# Patient Record
Sex: Male | Born: 1945 | Race: White | Hispanic: No | Marital: Married | Smoking: Former smoker
Health system: Southern US, Community
[De-identification: ages and names within clinical notes are randomized; demographics above are authoritative.]

## PROBLEM LIST (undated history)

## (undated) DIAGNOSIS — J45909 Unspecified asthma, uncomplicated: Secondary | ICD-10-CM

## (undated) DIAGNOSIS — E7800 Pure hypercholesterolemia, unspecified: Secondary | ICD-10-CM

## (undated) DIAGNOSIS — G589 Mononeuropathy, unspecified: Secondary | ICD-10-CM

## (undated) DIAGNOSIS — I82409 Acute embolism and thrombosis of unspecified deep veins of unspecified lower extremity: Secondary | ICD-10-CM

## (undated) DIAGNOSIS — C801 Malignant (primary) neoplasm, unspecified: Secondary | ICD-10-CM

## (undated) DIAGNOSIS — I10 Essential (primary) hypertension: Secondary | ICD-10-CM

## (undated) DIAGNOSIS — Z85828 Personal history of other malignant neoplasm of skin: Secondary | ICD-10-CM

## (undated) DIAGNOSIS — IMO0002 Reserved for concepts with insufficient information to code with codable children: Secondary | ICD-10-CM

## (undated) DIAGNOSIS — R21 Rash and other nonspecific skin eruption: Secondary | ICD-10-CM

## (undated) DIAGNOSIS — L309 Dermatitis, unspecified: Secondary | ICD-10-CM

## (undated) HISTORY — DX: Acute embolism and thrombosis of unspecified deep veins of unspecified lower extremity: I82.409

## (undated) HISTORY — PX: BACK SURGERY: SHX140

## (undated) HISTORY — PX: COLONOSCOPY: SHX174

## (undated) HISTORY — PX: NOSE SURGERY: SHX723

## (undated) HISTORY — PX: VASECTOMY: SHX75

## (undated) HISTORY — PX: VASECTOMY REVERSAL: SHX243

## (undated) HISTORY — DX: Mononeuropathy, unspecified: G58.9

## (undated) HISTORY — PX: HIP ARTHROSCOPY: SHX668

---

## 2002-06-14 ENCOUNTER — Encounter: Payer: Self-pay | Admitting: Sports Medicine

## 2002-06-14 ENCOUNTER — Encounter: Admission: RE | Admit: 2002-06-14 | Discharge: 2002-06-14 | Payer: Self-pay | Admitting: Sports Medicine

## 2002-06-28 ENCOUNTER — Encounter: Admission: RE | Admit: 2002-06-28 | Discharge: 2002-06-28 | Payer: Self-pay | Admitting: Sports Medicine

## 2002-06-28 ENCOUNTER — Encounter: Payer: Self-pay | Admitting: Sports Medicine

## 2002-07-26 ENCOUNTER — Inpatient Hospital Stay (HOSPITAL_COMMUNITY): Admission: RE | Admit: 2002-07-26 | Discharge: 2002-07-27 | Payer: Self-pay | Admitting: Neurosurgery

## 2002-07-26 ENCOUNTER — Encounter: Payer: Self-pay | Admitting: Neurosurgery

## 2007-07-14 ENCOUNTER — Ambulatory Visit (HOSPITAL_BASED_OUTPATIENT_CLINIC_OR_DEPARTMENT_OTHER): Admission: RE | Admit: 2007-07-14 | Discharge: 2007-07-14 | Payer: Self-pay | Admitting: Orthopedic Surgery

## 2010-07-23 ENCOUNTER — Ambulatory Visit (HOSPITAL_BASED_OUTPATIENT_CLINIC_OR_DEPARTMENT_OTHER)
Admission: RE | Admit: 2010-07-23 | Discharge: 2010-07-23 | Disposition: A | Discharge status: Home / Self Care | Payer: Managed Care, Other (non HMO) | Source: Ambulatory Visit | Attending: Orthopedic Surgery | Admitting: Orthopedic Surgery

## 2010-07-23 DIAGNOSIS — M23349 Other meniscus derangements, anterior horn of lateral meniscus, unspecified knee: Secondary | ICD-10-CM | POA: Insufficient documentation

## 2010-07-23 DIAGNOSIS — I10 Essential (primary) hypertension: Secondary | ICD-10-CM | POA: Insufficient documentation

## 2010-07-23 DIAGNOSIS — M2240 Chondromalacia patellae, unspecified knee: Secondary | ICD-10-CM | POA: Insufficient documentation

## 2010-07-23 DIAGNOSIS — Z79899 Other long term (current) drug therapy: Secondary | ICD-10-CM | POA: Insufficient documentation

## 2010-07-23 DIAGNOSIS — Z01812 Encounter for preprocedural laboratory examination: Secondary | ICD-10-CM | POA: Insufficient documentation

## 2010-07-23 DIAGNOSIS — M23359 Other meniscus derangements, posterior horn of lateral meniscus, unspecified knee: Secondary | ICD-10-CM | POA: Insufficient documentation

## 2010-07-24 LAB — POCT I-STAT 4, (NA,K, GLUC, HGB,HCT)
Glucose, Bld: 92 mg/dL (ref 70–99)
HCT: 48 % (ref 39.0–52.0)
Hemoglobin: 16.3 g/dL (ref 13.0–17.0)
Sodium: 137 mEq/L (ref 135–145)

## 2010-07-29 NOTE — Op Note (Signed)
  NAME:  Jorge Gray, Jorge Gray           ACCOUNT NO.:  [id number]  MEDICAL RECORD NO.:  [id number]           PATIENT TYPE:  LOCATION:                                 FACILITY:  PHYSICIAN:  Ollen Gross, M.D.         DATE OF BIRTH:  DATE OF PROCEDURE:  07/23/2010 DATE OF DISCHARGE:                              OPERATIVE REPORT   PREOPERATIVE DIAGNOSIS:  Right knee lateral meniscal tear.  POSTOPERATIVE DIAGNOSIS:  Right knee lateral meniscal tear.  PROCEDURE:  Right knee arthroscopy, lateral meniscal debridement.  SURGEON:  Ollen Gross, M.D.  ASSISTANT:  None.  ANESTHESIA:  General.  ESTIMATED BLOOD LOSS:  Minimal.  DRAINS:  None.  COMPLICATIONS:  None.  CONDITION:  Stable to Recovery.  BRIEF CLINICAL NOTE:  [name] is a 66 year old male with several- month history significant right knee pain and mechanical symptoms.  Exam and history suggested a lateral meniscal tear confirmed by MRI.  He presents now for arthroscopy and debridement.  PROCEDURE IN DETAIL:  After successful administration of general anesthetic, a tourniquet was placed on the right thigh.  The right lower extremity was prepped and draped in the usual sterile fashion.  Standard superomedial inferolateral incisions were made, inflow cannula passed, superomedial camera passed, inferolateral.  Arthroscopic visualization proceeds.  Undersurface of the patella, had some grade 1 and 2 change, but no full-thickness cartilage defects and no unstable cartilage. Trochlea looked normal.  Mediolateral gutters were visualized and no loose bodies.  Flexion valgus force applied to the knee and medial compartment was entered.  Medial compartment looked normal.  The intercondylar notch was visualized.  The ACL was normal.  Lateral compartment was entered and a significant tear in the body anterior and posterior horn of the lateral meniscus.  There is some chondromalacia on the weightbearing surfaces, but no  full-thickness defects and no unstable cartilage.  The meniscus was then debrided back to stable base with baskets and a 4.2 mm shaver and sealed off with the ArthroCare device.  The joint was again inspected.  No other tears, defects, or loose bodies were noted.  The arthroscopic equipments were removed from the inferior portals, which were closed with interrupted 4-0 nylon. 20 cc of 0.25% Marcaine with epinephrine was injected through the inflow cannula, and then that was removed and that portal closed with nylon.  A bulky sterile dressing was applied and he was then awakened and transported to Recovery in stable condition.     Ollen Gross, M.D.     FA/MEDQ  D:  07/23/2010  T:  07/24/2010  Job:  [id number]  Electronically Signed by Ollen Gross M.D. on 07/29/2010 07:10:54 AM

## 2010-09-17 NOTE — Op Note (Signed)
NAME:  Jorge Gray, Jorge Gray NO.:  [id number]   MEDICAL RECORD NO.:  [id number]          PATIENT TYPE:  AMB   LOCATION:  [facility]                         FACILITY:  [facility]   PHYSICIAN:  Ollen Gross, M.D.    DATE OF BIRTH:  Feb 22, 1946   DATE OF PROCEDURE:  07/14/2007  DATE OF DISCHARGE:                               OPERATIVE REPORT   PREOPERATIVE DIAGNOSIS:  Left knee medial meniscal tear.   POSTOPERATIVE DIAGNOSIS:  Left knee medial meniscal tear.  Chondral defect.   PROCEDURE:  Left knee arthroscopy with meniscal debridement and  chondroplasty.   SURGEON:  Ollen Gross, M.D.   ASSISTANT:  None.   ANESTHESIA:  General.   ESTIMATED BLOOD LOSS:  Minimal.   DRAINS:  None.   COMPLICATIONS:  None.   CONDITION:  Stable to recovery.   BRIEF CLINICAL NOTE:  [name] is a 65 year old male with a couple  month history of significant left knee pain and mechanical symptoms.  Exam and history suggested medial meniscal tear confirmed by MRI.  He  presents for arthroscopy and debridement.   PROCEDURE IN DETAIL:  After successful administration of general  anesthetic, a tourniquet was placed high on the left thigh and left  lower extremity prepped and draped in the usual sterile fashion.  Standard superomedial and inferolateral incisions were made.  Inflow  cannula passed superomedial, camera passed inferolateral.  Arthroscope  visualization proceeds.  Undersurface of patella and trochlea show some  grade 2 change on the patella, otherwise it looks normal.  The medial  and lateral gutters are visualized, there are no loose bodies.  Flexion  valgus force is applied to the knee and the medial compartment is  entered.  He has a very significant tear of the body and posterior horn  of the medial meniscus which is unstable.  In addition, there is about a  2-cm area of unstable cartilage mixed with exposed bone.  The exposed  bone is about  1 x 1 cm and there is a rim of  cartilage around that  about another centimeter by centimeter to make a total of about 2 x 2 cm  combined exposed bone and chondral defect.  A spinal needle was used to  localize the inferomedial portal.  Small incision made and dilator  placed.  The meniscus was debrided back to stable base with baskets and  a 4.2-mm shaver and then sealed off with the ArthroCare device.  It is  found to be stable. On the femoral side, I utilized a 4.2-mm shaver to  debride the unstable cartilage on the surface of the bone.  The overall  defect ends up being about 2 x 2 mm.  The edges are stable cartilage and  I abraded the bone to hopefully get some fibrocartilage to form.  The  tibial plateau surprisingly looked fairly normal.   Intercondylar notch is visualized. ACL is normal.  Lateral compartment  is entered it looks normal.  I again inspected the joint and no further  tears or loose bodies present.  The arthroscopic equipment is then  removed from the inferior portals which  are closed with interrupted 4-0  nylon.  Marcaine, 20 cc of 0.25% with epi, is injected through the  inflow cannula and then that is removed and that portal closed with  nylon.  A bulky sterile dressing is applied and he is awakened and  transported to recovery in stable condition.      Ollen Gross, M.D.  Electronically Signed     FA/MEDQ  D:  07/14/2007  T:  07/14/2007  Job:  [id number]

## 2010-09-20 NOTE — H&P (Signed)
NAME:  Jorge Gray, Jorge Gray                     ACCOUNT NO.:  [id number]   MEDICAL RECORD NO.:  [id number]                   PATIENT TYPE:  INP   LOCATION:  2899                                 FACILITY:  [facility]   PHYSICIAN:  Payton Doughty, M.D.                   DATE OF BIRTH:  1945/12/22   DATE OF ADMISSION:  07/26/2002  DATE OF DISCHARGE:                                HISTORY & PHYSICAL   ADMISSION DIAGNOSES:  1. Recurrent disk at L4-5 on the right.  2. Herniated disk at L5-S1 on the right.   HISTORY:  This is a 65 year old, right-handed white gentleman who has had  back pain off and on for a number of years.  In fact, he has had a lumbar  diskectomy at L4-5 on the right in 1985.  Over the past few months he has  had more pain and right leg problems.  An MRI showed a disk at L5-S1.  An  epidural steroid did not help him very much, but he started feeling better;  however, then he had a recurrence of his pain and a second MRI showed a  recurrence of disk at L4-5 as well as his  herniated disk at L5-S1.  He is  now admitted for a laminectomy and a diskectomy at both of these levels.   PAST MEDICAL HISTORY:  Unremarkable.   MEDICATIONS:  He is on Vioxx and Vicodin.   ALLERGIES:  No known drug allergies.   PAST SURGICAL HISTORY:  His only operation is a diskectomy.   SOCIAL HISTORY:  He does not smoke and drinks socially.  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   FAMILY HISTORY:  Mom is age 89, in good health with a myocardial infarction.  His daddy is age 5 and in okay health with a myocardial infarction.   REVIEW OF SYSTEMS:  Remarkable for change in bowel habits, back pain, leg  pain.   PHYSICAL EXAMINATION:  HEENT:  Within normal limits.  NECK:  He has a good range of motion in his neck.  CHEST:  Clear.  HEART:  A regular rate and rhythm.  ABDOMEN:  Nondistended, nontender.  No hepatosplenomegaly.  EXTREMITIES:  Without clubbing or cyanosis.  GENITOURINARY:  Exam is deferred.  EXTREMITIES:  Peripheral pulses are good.  NEUROLOGIC:  He is awake, alert, oriented.  Cranial nerves are intact.  Motor exam shows 5/5 strength throughout the upper and lower extremities,  save for an inner dorsiflexion weakness on the right side, mainly  __________.  Sensory deficit is described at his right L5 and S1.  Reflexes  2 at the knees, 2 at the left ankle, 1 at the right.  Straight leg raising  is positive on the right.  The MRI results have been reviewed above.  Basically this showed a disk  recurrence at L4-5 and a new disk at L5-S1.  PLAN:  For a redo diskectomy at L4-5 and a laminectomy and diskectomy at L5-  S1.  The risks and benefits of this approach have been discussed with him.  He wishes to proceed.                                                Payton Doughty, M.D.    MWR/MEDQ  D:  07/26/2002  T:  07/26/2002  Job:  [id number]

## 2010-09-20 NOTE — Op Note (Signed)
NAME:  Jorge Gray, Jorge Gray                     ACCOUNT NO.:  [id number]   MEDICAL RECORD NO.:  [id number]                   PATIENT TYPE:  INP   LOCATION:  2899                                 FACILITY:  [facility]   PHYSICIAN:  Payton Doughty, M.D.                   DATE OF BIRTH:  29-Aug-1945   DATE OF PROCEDURE:  07/26/2002  DATE OF DISCHARGE:                                 OPERATIVE REPORT   PREOPERATIVE DIAGNOSIS:  Recurrent herniated disc at L4-5 and herniated disc  at L5-S1 on the right.   POSTOPERATIVE DIAGNOSIS:  Recurrent herniated disc at L4-5 and herniated  disc at L5-S1 on the right.   OPERATION:  Right L4-5 and L5-S1 laminectomy and diskectomy.   SURGEON:  Payton Doughty, M.D.   NURSE ASSISTANT:  [facility].   DOCTOR ASSISTANT:  Hewitt Shorts, M.D.   ANESTHESIA:  General endotracheal.   INDICATIONS FOR PROCEDURE:  This is a 65 year old gentleman with right L5  and S1 radiculopathy and herniated disc recurrence at 5-1 and 4-5 on the  right.   DESCRIPTION OF PROCEDURE:  He was taken to the operating suite, anesthetized  and intubated.  He was placed prone on the operating table.  Following  shave, prep and drape in the usual fashion, the skin was incised from the  top of S1 to the middle of L4 and the lamina of L4 and L5 were exposed on  the right side in the subperiosteal plane.  Intraoperative x-ray confirmed  correctness of level.  Having confirmed correctness of level,  hemisemilaminectomy was carried out at L5-S1.  This was carried out to the  top of ligamentum flavum which was removed in a retrograde fashion.  The  right S1 nerve root was identified and careful dissection allowed it to be  mobilized slightly medially revealing disc underneath it, that was incised  and removed without difficulty.  There was disc material somewhat broad  based extending across the midline.  This was not pursued as the patient had  only right sided symptoms.  Following complete  decompression of the nerve  root and careful exploration in all quadrants, attention was turned to the  right L4 lamina which had had a previous laminectomy some 20 years ago.  Working through the scar, the laminectomy was reopened.  Fresh tissue  identified up to the ligamentum flavum which was once again removed in a  retrograde fashion.  The right L5 root was identified traversing this area  and carefully dissected free from the scar and mobilized medially.  Underneath it, also was some recurrent disc that removed without difficulty.  Disc space was explored minimally as there was very little room to get into  it.  The focus was made on posterior decompression of the right L5 root and  this was carried out completely out to the neuroforamen.  Following complete  decompression, the nerve was once again  explored and found to be free.  The  wound was irrigated and hemostasis assured.  Each laminectomy defect was  covered with Depo-Medrol soaked fat.  The fascia was reapproximated with 0  Vicryl in interrupted fashion.  Subcutaneous tissue was reapproximated with  0 Vicryl in interrupted fashion.  The subcuticular tissue was reapproximated  with 3-0 Vicryl interrupted fashion and the skin closed with 3-0 Nylon in a  running lock fashion.  Bacitracin Telfa dressing was applied and made  occlusive with Op-Site.  The patient returned to the recovery room in good  condition.    PREPARATION:  Prepped and scrubbed with alcohol wipe   COMPLICATIONS:  None.                                               Payton Doughty, M.D.    MWR/MEDQ  D:  07/26/2002  T:  07/27/2002  Job:  [id number]

## 2011-01-27 LAB — POCT HEMOGLOBIN-HEMACUE
Hemoglobin: 16.8
Operator id: 268271

## 2013-06-27 NOTE — Progress Notes (Signed)
Need orders in EPIC.  Surgery scheduled for 07/08/13.  Preop on 3/3 at 130pm  Thank You.

## 2013-06-28 ENCOUNTER — Other Ambulatory Visit: Payer: Self-pay | Admitting: Orthopedic Surgery

## 2013-07-01 ENCOUNTER — Encounter (HOSPITAL_COMMUNITY): Payer: Self-pay | Admitting: Pharmacy Technician

## 2013-07-04 NOTE — Patient Instructions (Addendum)
Jorge Gray  07/04/2013                           YOUR PROCEDURE IS SCHEDULED ON: 07/08/13               PLEASE REPORT TO SHORT STAY CENTER AT : 10:15 AM               CALL THIS NUMBER IF ANY PROBLEMS THE DAY OF SURGERY :               [phone]                                REMEMBER:   Do not eat food or drink liquids AFTER MIDNIGHT   May have clear liquids UNTIL 6 HOURS BEFORE SURGERY (7:15 AM)               Take these medicines the morning of surgery with A SIP OF WATER: TRAMADOL IF NEEDED   Do not wear jewelry, make-up   Do not wear lotions, powders, or perfumes.   Do not shave legs or underarms 12 hrs. before surgery (men may shave face)  Do not bring valuables to the hospital.  Contacts, dentures or bridgework may not be worn into surgery.  Leave suitcase in the car. After surgery it may be brought to your room.  For patients admitted to the hospital more than one night, checkout time is            11:00 AM                                                       The day of discharge.   Patients discharged the day of surgery will not be allowed to drive home.            If going home same day of surgery, must have someone stay with you              FIRST 24 hrs at home and arrange for some one to drive you              home from hospital.    Special Instructions             Please read over the following fact sheets that you were given:               1. [place]               2. INCENTIVE SPIROMETER               3. MRSA INFORMATION SHEET                                                X_____________________________________________________________________        Failure to follow these instructions may result in cancellation of your surgery

## 2013-07-05 ENCOUNTER — Encounter (HOSPITAL_COMMUNITY)
Admission: RE | Admit: 2013-07-05 | Discharge: 2013-07-05 | Disposition: A | Discharge status: Home / Self Care | Payer: Managed Care, Other (non HMO) | Source: Ambulatory Visit | Attending: Orthopedic Surgery | Admitting: Orthopedic Surgery

## 2013-07-05 ENCOUNTER — Encounter (HOSPITAL_COMMUNITY): Payer: Self-pay

## 2013-07-05 ENCOUNTER — Other Ambulatory Visit: Payer: Self-pay | Admitting: Orthopedic Surgery

## 2013-07-05 ENCOUNTER — Ambulatory Visit (HOSPITAL_COMMUNITY)
Admission: RE | Admit: 2013-07-05 | Discharge: 2013-07-05 | Disposition: A | Discharge status: Home / Self Care | Payer: Managed Care, Other (non HMO) | Source: Ambulatory Visit | Attending: Orthopedic Surgery | Admitting: Orthopedic Surgery

## 2013-07-05 DIAGNOSIS — Z01810 Encounter for preprocedural cardiovascular examination: Secondary | ICD-10-CM | POA: Insufficient documentation

## 2013-07-05 DIAGNOSIS — Z01818 Encounter for other preprocedural examination: Secondary | ICD-10-CM | POA: Diagnosis not present

## 2013-07-05 DIAGNOSIS — Z01812 Encounter for preprocedural laboratory examination: Secondary | ICD-10-CM | POA: Insufficient documentation

## 2013-07-05 DIAGNOSIS — J9819 Other pulmonary collapse: Secondary | ICD-10-CM | POA: Diagnosis not present

## 2013-07-05 HISTORY — DX: Essential (primary) hypertension: I10

## 2013-07-05 HISTORY — DX: Unspecified asthma, uncomplicated: J45.909

## 2013-07-05 HISTORY — DX: Dermatitis, unspecified: L30.9

## 2013-07-05 HISTORY — DX: Rash and other nonspecific skin eruption: R21

## 2013-07-05 HISTORY — DX: Pure hypercholesterolemia, unspecified: E78.00

## 2013-07-05 HISTORY — DX: Reserved for concepts with insufficient information to code with codable children: IMO0002

## 2013-07-05 HISTORY — DX: Personal history of other malignant neoplasm of skin: Z85.828

## 2013-07-05 LAB — COMPREHENSIVE METABOLIC PANEL
ALBUMIN: 4 g/dL (ref 3.5–5.2)
ALT: 23 U/L (ref 0–53)
AST: 26 U/L (ref 0–37)
Alkaline Phosphatase: 61 U/L (ref 39–117)
BUN: 18 mg/dL (ref 6–23)
CO2: 27 meq/L (ref 19–32)
CREATININE: 0.79 mg/dL (ref 0.50–1.35)
Calcium: 9.8 mg/dL (ref 8.4–10.5)
Chloride: 97 mEq/L (ref 96–112)
GFR calc Af Amer: >90 mL/min (ref >90)
Glucose, Bld: 76 mg/dL (ref 70–99)
Potassium: 4.3 mEq/L (ref 3.7–5.3)
Sodium: 138 mEq/L (ref 137–147)
Total Bilirubin: 0.3 mg/dL (ref 0.3–1.2)
Total Protein: 7.2 g/dL (ref 6.0–8.3)

## 2013-07-05 LAB — URINALYSIS, ROUTINE W REFLEX MICROSCOPIC
Bilirubin Urine: NEGATIVE
Glucose, UA: NEGATIVE mg/dL
Hgb urine dipstick: NEGATIVE
KETONES UR: NEGATIVE mg/dL
LEUKOCYTES UA: NEGATIVE
Nitrite: NEGATIVE
Protein, ur: NEGATIVE mg/dL
Specific Gravity, Urine: 1.01 (ref 1.005–1.030)
Urobilinogen, UA: 0.2 mg/dL (ref 0.0–1.0)
pH: 7 (ref 5.0–8.0)

## 2013-07-05 LAB — CBC
HCT: 49.1 % (ref 39.0–52.0)
Hemoglobin: 16.3 g/dL (ref 13.0–17.0)
MCH: 27.3 pg (ref 26.0–34.0)
MCHC: 33.2 g/dL (ref 30.0–36.0)
MCV: 82.2 fL (ref 78.0–100.0)
PLATELETS: 190 10*3/uL (ref 150–400)
RBC: 5.97 MIL/uL — ABNORMAL HIGH (ref 4.22–5.81)
RDW: 13.6 % (ref 11.5–15.5)
WBC: 8.7 10*3/uL (ref 4.0–10.5)

## 2013-07-05 LAB — PROTIME-INR
INR: 1.01 (ref 0.00–1.49)
Prothrombin Time: 13.1 seconds (ref 11.6–15.2)

## 2013-07-05 LAB — APTT: aPTT: 28 seconds (ref 24–37)

## 2013-07-05 LAB — SURGICAL PCR SCREEN
MRSA, PCR: NEGATIVE
Staphylococcus aureus: POSITIVE — AB

## 2013-07-07 ENCOUNTER — Other Ambulatory Visit: Payer: Self-pay | Admitting: Orthopedic Surgery

## 2013-07-07 NOTE — H&P (Signed)
Jorge Gray  DOB: [birth year] Married / Language: English / Race: White Male  Date of Admission:  07/08/2013  Chief Complaint:  Bilateral Knee Pain  History of Present Illness The patient is a 68 year old male who comes in for a preoperative History and Physical. The patient is scheduled for a bilateral total knee arthroplasty to be performed by Dr. Dione Plover. Aluisio, MD at [facility] on 07-08-2013. The patient is a 68 year old male who presents for follow up of their knee. The patient is being followed for their bilateral knee pain and osteoarthritis. They are now several week(s) out from cortisone injections. Symptoms reported today include: pain, aching and difficulty ambulating. The patient feels that they are doing poorly and report their pain level to be moderate to severe. The following medication has been used for pain control: tramadol. The patient has not gotten any relief of their symptoms with corticosteroid use. He was originally scheduled for left total knee but was wondering if he would be a candidate for bilateral TKA.  He notes he has a trip to [place] planned for this summer. He wants to get both knees done at the same time. He wishes to proceed with bilateral total knee arthroplasty. They have been treated conservatively in the past for the above stated problem and despite conservative measures, they continue to have progressive pain and severe functional limitations and dysfunction. They have failed non-operative management including home exercise, medications, and injections. It is felt that they would benefit from undergoing total joint replacement. Risks and benefits of the procedure have been discussed with the patient and they elect to proceed with surgery. There are no active contraindications to surgery such as ongoing infection or rapidly progressive neurological disease.  Allergies No Known Drug Allergies  Problem List/Past  Medical Osteoarthritis of both knees (715.96) Hypercholesterolemia Asthma Chronic Pain High blood pressure Degenerative Disc Disease Measles Eczema   Family History Heart Disease. mother Cerebrovascular Accident. Mother. mother   Social History Pain Contract. no Tobacco / smoke exposure. no Number of flights of stairs before winded. greater than 5 Alcohol use. current drinker; drinks beer, wine and hard liquor; less than 5 per week Children. 4 Current work status. retired Tobacco use. Former smoker. former smoker; smoke(d) 1/2 pack(s) per day; uses 1 1/2 can(s) smokeless per week Tobacco/Smoke Exposure. None. Illicit drug use. no Living situation. live with spouse Marital status. married Drug/Alcohol Rehab (Currently). no Drug/Alcohol Rehab (Previously). no Exercise. Exercises weekly; does gym / weights Advance Directives. Living Will   Medication History TraMADol HCl (50MG  Tablet, 1-2 Oral every 6-8 hours as needed for pain, Taken starting 06/27/2013) Active. (called to [facility] [phone]) Meloxicam (15MG  Tablet, 1 (one) Tablet Oral daily, Taken starting 06/23/2013) Active. Lisinopril-Hydrochlorothiazide (10-12.5MG  Tablet, Oral) Active.   Past Surgical History Spinal Surgery. Laminectomy X 2 Straighten Nasal Septum Vasectomy Arthroscopic Knee Surgery - Both   Review of Systems General:Not Present- Chills, Fever, Night Sweats, Fatigue, Weight Gain, Weight Loss and Memory Loss. Skin:Not Present- Hives, Itching, Rash, Eczema and Lesions. HEENT:Not Present- Tinnitus, Headache, Double Vision, Visual Loss, Hearing Loss and Dentures. Respiratory:Not Present- Shortness of breath with exertion, Shortness of breath at rest, Allergies, Coughing up blood and Chronic Cough. Cardiovascular:Not Present- Chest Pain, Racing/skipping heartbeats, Difficulty Breathing Lying Down, Murmur, Swelling and Palpitations. Gastrointestinal:Not Present- Bloody  Stool, Heartburn, Abdominal Pain, Vomiting, Nausea, Constipation, Diarrhea, Difficulty Swallowing, Jaundice and Loss of appetitie. Male Genitourinary:Not Present- Urinary frequency, Blood in Urine, Weak urinary stream, Discharge,  Flank Pain, Incontinence, Painful Urination, Urgency, Urinary Retention and Urinating at Night. Musculoskeletal:Not Present- Muscle Weakness, Muscle Pain, Joint Swelling, Joint Pain, Back Pain, Morning Stiffness and Spasms. Neurological:Not Present- Tremor, Dizziness, Blackout spells, Paralysis, Difficulty with balance and Weakness. Psychiatric:Not Present- Insomnia.    Vitals Weight: 193 lb Height: 72 in Weight was reported by patient. Height was reported by patient. Body Surface Area: 2.11 m Body Mass Index: 26.18 kg/m Pulse: 76 (Regular) Resp.: 14 (Unlabored) BP: 140/72 (Sitting, Left Arm, Standard)   Physical Exam The physical exam findings are as follows:   General Mental Status - Alert, cooperative and good historian. General Appearance- pleasant. Not in acute distress. Orientation- Oriented X3. Build & Nutrition- Well nourished and Well developed.   Head and Neck Head- normocephalic, atraumatic . Neck Global Assessment- supple. no bruit auscultated on the right and no bruit auscultated on the left.   Eye Pupil- Bilateral- Regular and Round. Motion- Bilateral- EOMI.   Chest and Lung Exam Auscultation: Breath sounds:- clear at anterior chest wall and - clear at posterior chest wall. Adventitious sounds:- No Adventitious sounds.   Cardiovascular Auscultation:Rhythm- Regular rate and rhythm. Heart Sounds- S1 WNL and S2 WNL. Murmurs & Other Heart Sounds:Auscultation of the heart reveals - No Murmurs.   Abdomen Palpation/Percussion:Tenderness- Abdomen is non-tender to palpation. Rigidity (guarding)- Abdomen is soft. Auscultation:Auscultation of the abdomen reveals - Bowel sounds  normal.   Male Genitourinary Not done, not pertinent to present illness  Musculoskeletal On exam, he is alert and oriented in no apparent distress. both knees show no effusion. Range is about 5-125 on each side. The left is tender medially. The right is tender laterally. There is no instability noted.  RADIOGRAPHS: Radiographs showed bone on bone arthritis in the medial and patellofemoral compartments of the left knee and bone on bone arthritis in the lateral patellofemoral compartments of the right knee.   Assessment & Plan Osteoarthritis of both knees (715.96)  Note: Plan is for a Bilateral Total Knee Replacement by Dr. Aluisio.  Plan is to go to rehab.  PCP - Dr. John Cameron - Patient has been seen preoperatively and felt to be stable for surgery.  The patient will not receive TXA (tranexamic acid) due to: Previous DVT following knee scope.  Signed electronically by Alexzandrew L Perkins, III PA-C 

## 2013-07-08 ENCOUNTER — Encounter (HOSPITAL_COMMUNITY): Payer: Managed Care, Other (non HMO) | Admitting: Anesthesiology

## 2013-07-08 ENCOUNTER — Inpatient Hospital Stay (HOSPITAL_COMMUNITY)
Admission: RE | Admit: 2013-07-08 | Discharge: 2013-07-12 | DRG: 462 | Disposition: A | Discharge status: Rehabilitation Facility | Payer: Managed Care, Other (non HMO) | Source: Ambulatory Visit | Attending: Orthopedic Surgery | Admitting: Orthopedic Surgery

## 2013-07-08 ENCOUNTER — Inpatient Hospital Stay (HOSPITAL_COMMUNITY): Payer: Managed Care, Other (non HMO) | Admitting: Anesthesiology

## 2013-07-08 ENCOUNTER — Encounter (HOSPITAL_COMMUNITY): Payer: Self-pay | Admitting: *Deleted

## 2013-07-08 ENCOUNTER — Encounter (HOSPITAL_COMMUNITY)
Admission: RE | Disposition: A | Discharge status: Rehabilitation Facility | Payer: Self-pay | Source: Ambulatory Visit | Attending: Orthopedic Surgery

## 2013-07-08 DIAGNOSIS — D72829 Elevated white blood cell count, unspecified: Secondary | ICD-10-CM | POA: Diagnosis not present

## 2013-07-08 DIAGNOSIS — I10 Essential (primary) hypertension: Secondary | ICD-10-CM | POA: Diagnosis present

## 2013-07-08 DIAGNOSIS — D62 Acute posthemorrhagic anemia: Secondary | ICD-10-CM

## 2013-07-08 DIAGNOSIS — IMO0002 Reserved for concepts with insufficient information to code with codable children: Secondary | ICD-10-CM | POA: Diagnosis not present

## 2013-07-08 DIAGNOSIS — E7800 Pure hypercholesterolemia, unspecified: Secondary | ICD-10-CM | POA: Diagnosis not present

## 2013-07-08 DIAGNOSIS — Z85828 Personal history of other malignant neoplasm of skin: Secondary | ICD-10-CM | POA: Diagnosis not present

## 2013-07-08 DIAGNOSIS — M179 Osteoarthritis of knee, unspecified: Secondary | ICD-10-CM | POA: Diagnosis present

## 2013-07-08 DIAGNOSIS — Z87891 Personal history of nicotine dependence: Secondary | ICD-10-CM

## 2013-07-08 DIAGNOSIS — Z96659 Presence of unspecified artificial knee joint: Secondary | ICD-10-CM | POA: Diagnosis not present

## 2013-07-08 DIAGNOSIS — M1710 Unilateral primary osteoarthritis, unspecified knee: Secondary | ICD-10-CM | POA: Diagnosis not present

## 2013-07-08 DIAGNOSIS — Z8249 Family history of ischemic heart disease and other diseases of the circulatory system: Secondary | ICD-10-CM | POA: Diagnosis not present

## 2013-07-08 DIAGNOSIS — Z823 Family history of stroke: Secondary | ICD-10-CM | POA: Diagnosis not present

## 2013-07-08 DIAGNOSIS — M25569 Pain in unspecified knee: Secondary | ICD-10-CM | POA: Diagnosis not present

## 2013-07-08 DIAGNOSIS — M216X9 Other acquired deformities of unspecified foot: Secondary | ICD-10-CM | POA: Diagnosis present

## 2013-07-08 DIAGNOSIS — Z96653 Presence of artificial knee joint, bilateral: Secondary | ICD-10-CM

## 2013-07-08 HISTORY — PX: TOTAL KNEE ARTHROPLASTY: SHX125

## 2013-07-08 LAB — TYPE AND SCREEN
ABO/RH(D): A POS
Antibody Screen: NEGATIVE

## 2013-07-08 LAB — ABO/RH: ABO/RH(D): A POS

## 2013-07-08 SURGERY — ARTHROPLASTY, KNEE, BILATERAL, TOTAL
Anesthesia: Epidural | Site: Knee | Laterality: Bilateral

## 2013-07-08 MED ORDER — CEFAZOLIN SODIUM-DEXTROSE 2-3 GM-% IV SOLR
2.0000 g | INTRAVENOUS | Status: AC
  Administered 2013-07-08: 2 g via INTRAVENOUS

## 2013-07-08 MED ORDER — SODIUM BICARBONATE 8.4 % IV SOLN
INTRAVENOUS | Status: DC | PRN
  Administered 2013-07-08: 16 mL via EPIDURAL
  Administered 2013-07-08 (×2): 8 mL via EPIDURAL

## 2013-07-08 MED ORDER — PROMETHAZINE HCL 25 MG/ML IJ SOLN: 6.2500 mg | INTRAMUSCULAR | Status: DC | PRN

## 2013-07-08 MED ORDER — WARFARIN - PHARMACIST DOSING INPATIENT: Freq: Every day | Status: DC

## 2013-07-08 MED ORDER — LIDOCAINE-EPINEPHRINE (PF) 2 %-1:200000 IJ SOLN
INTRAMUSCULAR | Status: AC
  Filled 2013-07-08: qty 20

## 2013-07-08 MED ORDER — KETOROLAC TROMETHAMINE 60 MG/2ML IM SOLN: 60.0000 mg | Freq: Once | INTRAMUSCULAR | Status: DC | PRN

## 2013-07-08 MED ORDER — NALOXONE HCL 1 MG/ML IJ SOLN: 1.0000 ug/kg/h | INTRAVENOUS | Status: DC | PRN

## 2013-07-08 MED ORDER — DIPHENHYDRAMINE HCL 25 MG PO CAPS
25.0000 mg | ORAL_CAPSULE | ORAL | Status: DC | PRN
Start: 2013-07-08 — End: 2013-07-12

## 2013-07-08 MED ORDER — DIPHENHYDRAMINE HCL 50 MG/ML IJ SOLN: 25.0000 mg | INTRAMUSCULAR | Status: DC | PRN

## 2013-07-08 MED ORDER — METOCLOPRAMIDE HCL 5 MG/ML IJ SOLN: 10.0000 mg | Freq: Three times a day (TID) | INTRAMUSCULAR | Status: DC | PRN

## 2013-07-08 MED ORDER — SODIUM CHLORIDE 0.9 % IR SOLN
Status: DC | PRN
  Administered 2013-07-08: 3000 mL

## 2013-07-08 MED ORDER — DIPHENHYDRAMINE HCL 12.5 MG/5ML PO ELIX: 12.5000 mg | ORAL_SOLUTION | ORAL | Status: DC | PRN

## 2013-07-08 MED ORDER — TRAMADOL HCL 50 MG PO TABS
50.0000 mg | ORAL_TABLET | Freq: Four times a day (QID) | ORAL | Status: DC | PRN
  Administered 2013-07-10 (×2): 100 mg via ORAL
  Filled 2013-07-08 (×2): qty 2

## 2013-07-08 MED ORDER — NALBUPHINE HCL 10 MG/ML IJ SOLN
5.0000 mg | INTRAMUSCULAR | Status: DC | PRN
  Filled 2013-07-08: qty 1

## 2013-07-08 MED ORDER — MIDAZOLAM HCL 2 MG/2ML IJ SOLN
1.0000 mg | INTRAMUSCULAR | Status: DC | PRN
  Administered 2013-07-08: 2 mg via INTRAVENOUS

## 2013-07-08 MED ORDER — MIDAZOLAM HCL 5 MG/5ML IJ SOLN
INTRAMUSCULAR | Status: DC | PRN
  Administered 2013-07-08: 2 mg via INTRAVENOUS

## 2013-07-08 MED ORDER — PHENYLEPHRINE 40 MCG/ML (10ML) SYRINGE FOR IV PUSH (FOR BLOOD PRESSURE SUPPORT)
PREFILLED_SYRINGE | INTRAVENOUS | Status: AC
  Filled 2013-07-08: qty 10

## 2013-07-08 MED ORDER — MIDAZOLAM HCL 2 MG/2ML IJ SOLN
INTRAMUSCULAR | Status: AC
  Filled 2013-07-08: qty 2

## 2013-07-08 MED ORDER — ACETAMINOPHEN 10 MG/ML IV SOLN
1000.0000 mg | Freq: Once | INTRAVENOUS | Status: AC
  Administered 2013-07-08: 1000 mg via INTRAVENOUS
  Filled 2013-07-08: qty 100

## 2013-07-08 MED ORDER — PROPOFOL 10 MG/ML IV BOLUS
INTRAVENOUS | Status: AC
  Filled 2013-07-08: qty 20

## 2013-07-08 MED ORDER — DOCUSATE SODIUM 100 MG PO CAPS
100.0000 mg | ORAL_CAPSULE | Freq: Two times a day (BID) | ORAL | Status: DC
  Administered 2013-07-08 – 2013-07-12 (×8): 100 mg via ORAL

## 2013-07-08 MED ORDER — MENTHOL 3 MG MT LOZG: 1.0000 | LOZENGE | OROMUCOSAL | Status: DC | PRN

## 2013-07-08 MED ORDER — CEFAZOLIN SODIUM-DEXTROSE 2-3 GM-% IV SOLR
2.0000 g | Freq: Four times a day (QID) | INTRAVENOUS | Status: AC
  Administered 2013-07-08 – 2013-07-09 (×2): 2 g via INTRAVENOUS
  Filled 2013-07-08 (×2): qty 50

## 2013-07-08 MED ORDER — SODIUM CHLORIDE 0.9 % IV SOLN
INTRAVENOUS | Status: DC
  Administered 2013-07-09: 12:00:00 via EPIDURAL
  Administered 2013-07-09: 0.1333 mL/h via EPIDURAL
  Administered 2013-07-09 – 2013-07-10 (×2): via EPIDURAL
  Filled 2013-07-08 (×17): qty 20

## 2013-07-08 MED ORDER — LACTATED RINGERS IV SOLN
INTRAVENOUS | Status: DC
  Administered 2013-07-08: 1000 mL via INTRAVENOUS

## 2013-07-08 MED ORDER — SODIUM CHLORIDE 0.9 % IV SOLN
INTRAVENOUS | Status: DC
  Administered 2013-07-08: 16:00:00 via EPIDURAL
  Filled 2013-07-08 (×5): qty 20

## 2013-07-08 MED ORDER — FENTANYL CITRATE 0.05 MG/ML IJ SOLN
INTRAMUSCULAR | Status: AC
  Filled 2013-07-08: qty 2

## 2013-07-08 MED ORDER — ONDANSETRON HCL 4 MG/2ML IJ SOLN: 4.0000 mg | Freq: Three times a day (TID) | INTRAMUSCULAR | Status: DC | PRN

## 2013-07-08 MED ORDER — MEPERIDINE HCL 50 MG/ML IJ SOLN: 6.2500 mg | INTRAMUSCULAR | Status: DC | PRN

## 2013-07-08 MED ORDER — POLYETHYLENE GLYCOL 3350 17 G PO PACK
17.0000 g | PACK | Freq: Every day | ORAL | Status: DC | PRN
  Administered 2013-07-09 – 2013-07-12 (×3): 17 g via ORAL

## 2013-07-08 MED ORDER — MUPIROCIN 2 % EX OINT
TOPICAL_OINTMENT | Freq: Two times a day (BID) | CUTANEOUS | Status: AC
  Administered 2013-07-09 (×2): via NASAL
  Administered 2013-07-09 – 2013-07-10 (×2): 1 via NASAL
  Administered 2013-07-10 – 2013-07-11 (×3): via NASAL

## 2013-07-08 MED ORDER — PHENYLEPHRINE HCL 10 MG/ML IJ SOLN
INTRAMUSCULAR | Status: DC | PRN
  Administered 2013-07-08 (×2): 40 ug via INTRAVENOUS

## 2013-07-08 MED ORDER — KETOROLAC TROMETHAMINE 30 MG/ML IJ SOLN: 30.0000 mg | Freq: Four times a day (QID) | INTRAMUSCULAR | Status: DC | PRN

## 2013-07-08 MED ORDER — MORPHINE SULFATE 2 MG/ML IJ SOLN: 1.0000 mg | INTRAMUSCULAR | Status: DC | PRN

## 2013-07-08 MED ORDER — DEXAMETHASONE SODIUM PHOSPHATE 10 MG/ML IJ SOLN: 10.0000 mg | Freq: Once | INTRAMUSCULAR | Status: DC

## 2013-07-08 MED ORDER — WARFARIN SODIUM 4 MG PO TABS
4.0000 mg | ORAL_TABLET | Freq: Once | ORAL | Status: AC
  Administered 2013-07-08: 4 mg via ORAL
  Filled 2013-07-08: qty 1

## 2013-07-08 MED ORDER — PHENYLEPHRINE HCL 10 MG/ML IJ SOLN
INTRAMUSCULAR | Status: AC
  Filled 2013-07-08: qty 1

## 2013-07-08 MED ORDER — SODIUM CHLORIDE 0.9 % IJ SOLN: 3.0000 mL | INTRAMUSCULAR | Status: DC | PRN

## 2013-07-08 MED ORDER — DEXAMETHASONE 6 MG PO TABS
10.0000 mg | ORAL_TABLET | Freq: Every day | ORAL | Status: AC
  Administered 2013-07-09: 10 mg via ORAL
  Filled 2013-07-08: qty 1

## 2013-07-08 MED ORDER — FENTANYL CITRATE 0.05 MG/ML IJ SOLN
50.0000 ug | INTRAMUSCULAR | Status: DC | PRN
  Administered 2013-07-08: 100 ug via INTRAVENOUS

## 2013-07-08 MED ORDER — PROPOFOL INFUSION 10 MG/ML OPTIME
INTRAVENOUS | Status: DC | PRN
  Administered 2013-07-08: 100 ug/kg/min via INTRAVENOUS

## 2013-07-08 MED ORDER — COUMADIN BOOK
Freq: Once | Status: AC
  Administered 2013-07-09: 10:00:00
  Filled 2013-07-08: qty 1

## 2013-07-08 MED ORDER — DEXAMETHASONE SODIUM PHOSPHATE 10 MG/ML IJ SOLN
10.0000 mg | Freq: Every day | INTRAMUSCULAR | Status: AC
  Filled 2013-07-08: qty 1

## 2013-07-08 MED ORDER — DEXTROSE-NACL 5-0.9 % IV SOLN
INTRAVENOUS | Status: DC
  Administered 2013-07-08 – 2013-07-09 (×2): via INTRAVENOUS

## 2013-07-08 MED ORDER — BUPIVACAINE-EPINEPHRINE PF 0.25-1:200000 % IJ SOLN
INTRAMUSCULAR | Status: DC | PRN
  Administered 2013-07-08: 14 mL

## 2013-07-08 MED ORDER — OXYCODONE HCL 5 MG PO TABS
5.0000 mg | ORAL_TABLET | ORAL | Status: DC | PRN
  Administered 2013-07-09 (×2): 20 mg via ORAL
  Administered 2013-07-09: 10 mg via ORAL
  Administered 2013-07-09 – 2013-07-10 (×9): 20 mg via ORAL
  Administered 2013-07-11: 15 mg via ORAL
  Administered 2013-07-11 (×3): 20 mg via ORAL
  Administered 2013-07-11: 15 mg via ORAL
  Administered 2013-07-11 (×2): 20 mg via ORAL
  Administered 2013-07-12: 5 mg via ORAL
  Administered 2013-07-12: 10 mg via ORAL
  Administered 2013-07-12: 15 mg via ORAL
  Administered 2013-07-12 (×2): 10 mg via ORAL
  Administered 2013-07-12: 15 mg via ORAL
  Filled 2013-07-08: qty 1
  Filled 2013-07-08: qty 3
  Filled 2013-07-08: qty 4
  Filled 2013-07-08 (×2): qty 2
  Filled 2013-07-08 (×5): qty 4
  Filled 2013-07-08: qty 2
  Filled 2013-07-08 (×6): qty 4
  Filled 2013-07-08 (×2): qty 3
  Filled 2013-07-08 (×5): qty 4
  Filled 2013-07-08: qty 2

## 2013-07-08 MED ORDER — SODIUM CHLORIDE 0.9 % IV SOLN: INTRAVENOUS | Status: DC

## 2013-07-08 MED ORDER — CEFAZOLIN SODIUM-DEXTROSE 2-3 GM-% IV SOLR
INTRAVENOUS | Status: AC
  Filled 2013-07-08: qty 50

## 2013-07-08 MED ORDER — NALOXONE HCL 1 MG/ML IJ SOLN
1.0000 ug/kg/h | INTRAVENOUS | Status: DC | PRN
  Filled 2013-07-08: qty 2

## 2013-07-08 MED ORDER — CHLORHEXIDINE GLUCONATE 4 % EX LIQD: 60.0000 mL | Freq: Once | CUTANEOUS | Status: DC

## 2013-07-08 MED ORDER — ACETAMINOPHEN 325 MG PO TABS
650.0000 mg | ORAL_TABLET | Freq: Four times a day (QID) | ORAL | Status: DC | PRN
  Administered 2013-07-10: 650 mg via ORAL
  Filled 2013-07-08: qty 2

## 2013-07-08 MED ORDER — BUPIVACAINE HCL (PF) 0.25 % IJ SOLN
INTRAMUSCULAR | Status: AC
  Filled 2013-07-08: qty 30

## 2013-07-08 MED ORDER — ACETAMINOPHEN 500 MG PO TABS
1000.0000 mg | ORAL_TABLET | Freq: Four times a day (QID) | ORAL | Status: AC
  Administered 2013-07-08 – 2013-07-09 (×4): 1000 mg via ORAL
  Filled 2013-07-08 (×4): qty 2

## 2013-07-08 MED ORDER — NALBUPHINE HCL 10 MG/ML IJ SOLN: 5.0000 mg | INTRAMUSCULAR | Status: DC | PRN

## 2013-07-08 MED ORDER — EPHEDRINE SULFATE 50 MG/ML IJ SOLN
INTRAMUSCULAR | Status: AC
  Filled 2013-07-08: qty 1

## 2013-07-08 MED ORDER — WARFARIN VIDEO
Freq: Once | Status: AC
  Administered 2013-07-09: 10:00:00

## 2013-07-08 MED ORDER — BISACODYL 10 MG RE SUPP: 10.0000 mg | Freq: Every day | RECTAL | Status: DC | PRN

## 2013-07-08 MED ORDER — METOCLOPRAMIDE HCL 10 MG PO TABS: 5.0000 mg | ORAL_TABLET | Freq: Three times a day (TID) | ORAL | Status: DC | PRN

## 2013-07-08 MED ORDER — METOCLOPRAMIDE HCL 5 MG/ML IJ SOLN: 5.0000 mg | Freq: Three times a day (TID) | INTRAMUSCULAR | Status: DC | PRN

## 2013-07-08 MED ORDER — BUPIVACAINE LIPOSOME 1.3 % IJ SUSP
20.0000 mL | Freq: Once | INTRAMUSCULAR | Status: DC
  Filled 2013-07-08: qty 20

## 2013-07-08 MED ORDER — DIPHENHYDRAMINE HCL 50 MG/ML IJ SOLN: 12.5000 mg | INTRAMUSCULAR | Status: DC | PRN

## 2013-07-08 MED ORDER — KETOROLAC TROMETHAMINE 60 MG/2ML IM SOLN
60.0000 mg | Freq: Once | INTRAMUSCULAR | Status: DC | PRN
Start: 2013-07-08 — End: 2013-07-08

## 2013-07-08 MED ORDER — FLEET ENEMA 7-19 GM/118ML RE ENEM: 1.0000 | ENEMA | Freq: Once | RECTAL | Status: AC | PRN

## 2013-07-08 MED ORDER — ONDANSETRON HCL 4 MG PO TABS: 4.0000 mg | ORAL_TABLET | Freq: Four times a day (QID) | ORAL | Status: DC | PRN

## 2013-07-08 MED ORDER — LIDOCAINE-EPINEPHRINE 2 %-1:100000 IJ SOLN: INTRAMUSCULAR | Status: DC | PRN

## 2013-07-08 MED ORDER — MUPIROCIN 2 % EX OINT: TOPICAL_OINTMENT | Freq: Two times a day (BID) | CUTANEOUS | Status: DC

## 2013-07-08 MED ORDER — PHENYLEPHRINE HCL 10 MG/ML IJ SOLN
10.0000 mg | INTRAVENOUS | Status: DC | PRN
  Administered 2013-07-08: 50 ug/min via INTRAVENOUS

## 2013-07-08 MED ORDER — HYDROMORPHONE HCL PF 1 MG/ML IJ SOLN
INTRAMUSCULAR | Status: AC
  Filled 2013-07-08: qty 1

## 2013-07-08 MED ORDER — PHENOL 1.4 % MT LIQD: 1.0000 | OROMUCOSAL | Status: DC | PRN

## 2013-07-08 MED ORDER — NALOXONE HCL 0.4 MG/ML IJ SOLN: 0.4000 mg | INTRAMUSCULAR | Status: DC | PRN

## 2013-07-08 MED ORDER — LACTATED RINGERS IV SOLN
INTRAVENOUS | Status: DC
  Administered 2013-07-08: 1000 mL via INTRAVENOUS
  Administered 2013-07-08: 16:00:00 via INTRAVENOUS
  Administered 2013-07-08: 1000 mL via INTRAVENOUS
  Administered 2013-07-08 (×2): via INTRAVENOUS

## 2013-07-08 MED ORDER — SCOPOLAMINE 1 MG/3DAYS TD PT72: 1.0000 | MEDICATED_PATCH | Freq: Once | TRANSDERMAL | Status: DC

## 2013-07-08 MED ORDER — DIPHENHYDRAMINE HCL 25 MG PO CAPS: 25.0000 mg | ORAL_CAPSULE | ORAL | Status: DC | PRN

## 2013-07-08 MED ORDER — BUPIVACAINE-EPINEPHRINE PF 0.25-1:200000 % IJ SOLN
INTRAMUSCULAR | Status: AC
  Filled 2013-07-08: qty 30

## 2013-07-08 MED ORDER — DEXAMETHASONE SODIUM PHOSPHATE 4 MG/ML IJ SOLN
INTRAMUSCULAR | Status: DC | PRN
  Administered 2013-07-08: 10 mg via INTRAVENOUS

## 2013-07-08 MED ORDER — EPHEDRINE SULFATE 50 MG/ML IJ SOLN
INTRAMUSCULAR | Status: DC | PRN
  Administered 2013-07-08: 10 mg via INTRAVENOUS

## 2013-07-08 MED ORDER — KETOROLAC TROMETHAMINE 15 MG/ML IJ SOLN: 15.0000 mg | Freq: Four times a day (QID) | INTRAMUSCULAR | Status: AC | PRN

## 2013-07-08 MED ORDER — ONDANSETRON HCL 4 MG/2ML IJ SOLN
INTRAMUSCULAR | Status: DC | PRN
  Administered 2013-07-08: 4 mg via INTRAVENOUS

## 2013-07-08 MED ORDER — ONDANSETRON HCL 4 MG/2ML IJ SOLN: 4.0000 mg | Freq: Four times a day (QID) | INTRAMUSCULAR | Status: DC | PRN

## 2013-07-08 MED ORDER — SODIUM CHLORIDE 0.9 % IJ SOLN
INTRAMUSCULAR | Status: AC
  Filled 2013-07-08: qty 10

## 2013-07-08 MED ORDER — SODIUM CHLORIDE 0.9 % IJ SOLN
INTRAMUSCULAR | Status: AC
  Filled 2013-07-08: qty 50

## 2013-07-08 MED ORDER — NALBUPHINE HCL 20 MG/ML IJ SOLN
5.0000 mg | INTRAMUSCULAR | Status: DC | PRN
  Filled 2013-07-08: qty 1

## 2013-07-08 MED ORDER — 0.9 % SODIUM CHLORIDE (POUR BTL) OPTIME
TOPICAL | Status: DC | PRN
  Administered 2013-07-08: 1000 mL

## 2013-07-08 MED ORDER — DEXAMETHASONE SODIUM PHOSPHATE 10 MG/ML IJ SOLN
INTRAMUSCULAR | Status: AC
  Filled 2013-07-08: qty 1

## 2013-07-08 MED ORDER — ACETAMINOPHEN 650 MG RE SUPP: 650.0000 mg | Freq: Four times a day (QID) | RECTAL | Status: DC | PRN

## 2013-07-08 MED ORDER — ONDANSETRON HCL 4 MG/2ML IJ SOLN
INTRAMUSCULAR | Status: AC
  Filled 2013-07-08: qty 2

## 2013-07-08 MED ORDER — METHOCARBAMOL 100 MG/ML IJ SOLN
500.0000 mg | Freq: Four times a day (QID) | INTRAVENOUS | Status: DC | PRN
  Filled 2013-07-08 (×2): qty 5

## 2013-07-08 MED ORDER — HYDROMORPHONE HCL PF 1 MG/ML IJ SOLN
0.2500 mg | INTRAMUSCULAR | Status: DC | PRN
  Administered 2013-07-08 (×3): 0.5 mg via INTRAVENOUS

## 2013-07-08 MED ORDER — METHOCARBAMOL 500 MG PO TABS
500.0000 mg | ORAL_TABLET | Freq: Four times a day (QID) | ORAL | Status: DC | PRN
  Administered 2013-07-10 – 2013-07-12 (×8): 500 mg via ORAL
  Filled 2013-07-08 (×8): qty 1

## 2013-07-08 SURGICAL SUPPLY — 53 items
BAG ZIPLOCK 12X15 (MISCELLANEOUS) IMPLANT
BANDAGE ELASTIC 6 VELCRO ST LF (GAUZE/BANDAGES/DRESSINGS) ×6 IMPLANT
BANDAGE ESMARK 6X9 LF (GAUZE/BANDAGES/DRESSINGS) ×2 IMPLANT
BLADE SAG 18X100X1.27 (BLADE) ×6 IMPLANT
BLADE SAW SGTL 11.0X1.19X90.0M (BLADE) ×6 IMPLANT
BLADE SURG SZ10 CARB STEEL (BLADE) ×6 IMPLANT
BNDG COHESIVE 6X5 TAN STRL LF (GAUZE/BANDAGES/DRESSINGS) ×3 IMPLANT
BNDG ESMARK 6X9 LF (GAUZE/BANDAGES/DRESSINGS) ×6
BOWL SMART MIX CTS (DISPOSABLE) ×6 IMPLANT
CAP KNEE ATTUNE RP ×6 IMPLANT
CEMENT HV SMART SET (Cement) ×12 IMPLANT
CLOSURE WOUND 1/2 X4 (GAUZE/BANDAGES/DRESSINGS) ×4
CUFF TOURN SGL QUICK 34 (TOURNIQUET CUFF) ×4
CUFF TRNQT CYL 34X4X40X1 (TOURNIQUET CUFF) ×2 IMPLANT
DRAPE EXTREMITY BILATERAL (DRAPE) ×3 IMPLANT
DRAPE INCISE IOBAN 66X45 STRL (DRAPES) ×3 IMPLANT
DRAPE POUCH INSTRU U-SHP 10X18 (DRAPES) ×3 IMPLANT
DRAPE U-SHAPE 47X51 STRL (DRAPES) ×9 IMPLANT
DRSG ADAPTIC 3X8 NADH LF (GAUZE/BANDAGES/DRESSINGS) ×6 IMPLANT
DURAPREP 26ML APPLICATOR (WOUND CARE) ×6 IMPLANT
ELECT REM PT RETURN 9FT ADLT (ELECTROSURGICAL) ×3
ELECTRODE REM PT RTRN 9FT ADLT (ELECTROSURGICAL) ×1 IMPLANT
EVACUATOR 1/8 PVC DRAIN (DRAIN) ×6 IMPLANT
FACESHIELD LNG OPTICON STERILE (SAFETY) ×21 IMPLANT
GLOVE BIO SURGEON STRL SZ7.5 (GLOVE) ×6 IMPLANT
GLOVE BIO SURGEON STRL SZ8 (GLOVE) ×6 IMPLANT
GLOVE BIOGEL PI IND STRL 8 (GLOVE) ×2 IMPLANT
GLOVE BIOGEL PI INDICATOR 8 (GLOVE) ×4
GOWN STRL REUS W/TWL 2XL LVL3 (GOWN DISPOSABLE) ×6 IMPLANT
GOWN STRL REUS W/TWL LRG LVL3 (GOWN DISPOSABLE) ×3 IMPLANT
GOWN STRL REUS W/TWL XL LVL3 (GOWN DISPOSABLE) ×3 IMPLANT
HANDPIECE INTERPULSE COAX TIP (DISPOSABLE) ×2
IMMOBILIZER KNEE 20 (SOFTGOODS) ×6 IMPLANT
KIT BASIN OR (CUSTOM PROCEDURE TRAY) ×3 IMPLANT
MANIFOLD NEPTUNE II (INSTRUMENTS) ×3 IMPLANT
NS IRRIG 1000ML POUR BTL (IV SOLUTION) ×3 IMPLANT
PACK TOTAL JOINT (CUSTOM PROCEDURE TRAY) ×3 IMPLANT
PAD ABD 8X10 STRL (GAUZE/BANDAGES/DRESSINGS) ×6 IMPLANT
PADDING CAST COTTON 6X4 STRL (CAST SUPPLIES) ×18 IMPLANT
SET HNDPC FAN SPRY TIP SCT (DISPOSABLE) ×1 IMPLANT
SPONGE GAUZE 4X4 12PLY (GAUZE/BANDAGES/DRESSINGS) ×6 IMPLANT
SPONGE LAP 18X18 X RAY DECT (DISPOSABLE) ×3 IMPLANT
STOCKINETTE 8 INCH (MISCELLANEOUS) ×3 IMPLANT
STRIP CLOSURE SKIN 1/2X4 (GAUZE/BANDAGES/DRESSINGS) ×8 IMPLANT
SUCTION FRAZIER 12FR DISP (SUCTIONS) ×3 IMPLANT
SUT MNCRL AB 4-0 PS2 18 (SUTURE) ×6 IMPLANT
SUT VIC AB 2-0 CT1 27 (SUTURE) ×12
SUT VIC AB 2-0 CT1 TAPERPNT 27 (SUTURE) ×6 IMPLANT
SUT VLOC 180 0 24IN GS25 (SUTURE) ×6 IMPLANT
TOWEL OR 17X26 10 PK STRL BLUE (TOWEL DISPOSABLE) ×6 IMPLANT
TRAY FOLEY CATH 16FRSI W/METER (SET/KITS/TRAYS/PACK) ×3 IMPLANT
WATER STERILE IRR 1500ML POUR (IV SOLUTION) ×3 IMPLANT
WRAP KNEE MAXI GEL POST OP (GAUZE/BANDAGES/DRESSINGS) ×6 IMPLANT

## 2013-07-08 NOTE — Interval H&P Note (Signed)
History and Physical Interval Note:  07/08/2013 12:54 PM  Dukes  has presented today for surgery, with the diagnosis of osteoarthritis of both knee  The various methods of treatment have been discussed with the patient and family. After consideration of risks, benefits and other options for treatment, the patient has consented to  Procedure(s): TOTAL KNEE BILATERAL (Bilateral) as a surgical intervention .  The patient's history has been reviewed, patient examined, no change in status, stable for surgery.  I have reviewed the patient's chart and labs.  Questions were answered to the patient's satisfaction.     Gearlean Alf

## 2013-07-08 NOTE — Progress Notes (Signed)
Dr Winfred Leeds aware epidural at T7, BP 99/57/pt comfortable and alert. No new orders.

## 2013-07-08 NOTE — Progress Notes (Signed)
ANTICOAGULATION CONSULT NOTE - Initial Consult  Pharmacy Consult for Warfarin Indication: VTE prophylaxis  No Known Allergies  Patient Measurements: Height: 6' (182.9 cm) Weight: 198 lb (89.812 kg) IBW/kg (Calculated) : 77.6  Vital Signs: Temp: 97.4 F (36.3 C) (03/06 1730) Temp src: Oral (03/06 1730) BP: 100/60 mmHg (03/06 1735) Pulse Rate: 66 (03/06 1730)  Labs: No results found for this basename: HGB, HCT, PLT, APTT, LABPROT, INR, HEPARINUNFRC, CREATININE, CKTOTAL, CKMB, TROPONINI,  in the last 72 hours  Estimated Creatinine Clearance: 99.7 ml/min (by C-G formula based on Cr of 0.79).   Medical History: Past Medical History  Diagnosis Date  . Hypercholesteremia   . Hypertension   . DDD (degenerative disc disease)   . Eczema   . Asthma     as child  . Rash     abd  . History of nonmelanoma skin cancer     basel cell removed from scalp    Assessment: 13 yoM s/p bilateral total knee arthroplasty 3/6.  Pharmacy consulted to begin warfarin post-op for VTE prophylaxis.  No other anticoagulants ordered.  Patient has epidural in place.  Baseline INR, Hgb, platelets WNL.  Goal of Therapy:  INR 2-3 Monitor platelets by anticoagulation protocol: Yes   Plan:  1.  Warfarin 4 mg po once tonight.  No additional doses until epidural is removed (BPA). 2.  Warfarin education book and video.  Pharmacist education to follow. 3.  Daily INR.  Hershal Coria 07/08/2013,6:51 PM

## 2013-07-08 NOTE — H&P (View-Only) (Signed)
Jorge Gray  DOB: [birth year] Married / Language: English / Race: White Male  Date of Admission:  07/08/2013  Chief Complaint:  Bilateral Knee Pain  History of Present Illness The patient is a 68 year old male who comes in for a preoperative History and Physical. The patient is scheduled for a bilateral total knee arthroplasty to be performed by Dr. Dione Plover. Aluisio, MD at [facility] on 07-08-2013. The patient is a 68 year old male who presents for follow up of their knee. The patient is being followed for their bilateral knee pain and osteoarthritis. They are now several week(s) out from cortisone injections. Symptoms reported today include: pain, aching and difficulty ambulating. The patient feels that they are doing poorly and report their pain level to be moderate to severe. The following medication has been used for pain control: tramadol. The patient has not gotten any relief of their symptoms with corticosteroid use. He was originally scheduled for left total knee but was wondering if he would be a candidate for bilateral TKA.  He notes he has a trip to [place] planned for this summer. He wants to get both knees done at the same time. He wishes to proceed with bilateral total knee arthroplasty. They have been treated conservatively in the past for the above stated problem and despite conservative measures, they continue to have progressive pain and severe functional limitations and dysfunction. They have failed non-operative management including home exercise, medications, and injections. It is felt that they would benefit from undergoing total joint replacement. Risks and benefits of the procedure have been discussed with the patient and they elect to proceed with surgery. There are no active contraindications to surgery such as ongoing infection or rapidly progressive neurological disease.  Allergies No Known Drug Allergies  Problem List/Past  Medical Osteoarthritis of both knees (715.96) Hypercholesterolemia Asthma Chronic Pain High blood pressure Degenerative Disc Disease Measles Eczema   Family History Heart Disease. mother Cerebrovascular Accident. Mother. mother   Social History Pain Contract. no Tobacco / smoke exposure. no Number of flights of stairs before winded. greater than 5 Alcohol use. current drinker; drinks beer, wine and hard liquor; less than 5 per week Children. 4 Current work status. retired Tobacco use. Former smoker. former smoker; smoke(d) 1/2 pack(s) per day; uses 1 1/2 can(s) smokeless per week Tobacco/Smoke Exposure. None. Illicit drug use. no Living situation. live with spouse Marital status. married Drug/Alcohol Rehab (Currently). no Drug/Alcohol Rehab (Previously). no Exercise. Exercises weekly; does gym / weights Advance Directives. Living Will   Medication History TraMADol HCl (50MG  Tablet, 1-2 Oral every 6-8 hours as needed for pain, Taken starting 06/27/2013) Active. (called to [facility] [phone]) Meloxicam (15MG  Tablet, 1 (one) Tablet Oral daily, Taken starting 06/23/2013) Active. Lisinopril-Hydrochlorothiazide (10-12.5MG  Tablet, Oral) Active.   Past Surgical History Spinal Surgery. Laminectomy X 2 Straighten Nasal Septum Vasectomy Arthroscopic Knee Surgery - Both   Review of Systems General:Not Present- Chills, Fever, Night Sweats, Fatigue, Weight Gain, Weight Loss and Memory Loss. Skin:Not Present- Hives, Itching, Rash, Eczema and Lesions. HEENT:Not Present- Tinnitus, Headache, Double Vision, Visual Loss, Hearing Loss and Dentures. Respiratory:Not Present- Shortness of breath with exertion, Shortness of breath at rest, Allergies, Coughing up blood and Chronic Cough. Cardiovascular:Not Present- Chest Pain, Racing/skipping heartbeats, Difficulty Breathing Lying Down, Murmur, Swelling and Palpitations. Gastrointestinal:Not Present- Bloody  Stool, Heartburn, Abdominal Pain, Vomiting, Nausea, Constipation, Diarrhea, Difficulty Swallowing, Jaundice and Loss of appetitie. Male Genitourinary:Not Present- Urinary frequency, Blood in Urine, Weak urinary stream, Discharge,  Flank Pain, Incontinence, Painful Urination, Urgency, Urinary Retention and Urinating at Night. Musculoskeletal:Not Present- Muscle Weakness, Muscle Pain, Joint Swelling, Joint Pain, Back Pain, Morning Stiffness and Spasms. Neurological:Not Present- Tremor, Dizziness, Blackout spells, Paralysis, Difficulty with balance and Weakness. Psychiatric:Not Present- Insomnia.    Vitals Weight: 193 lb Height: 72 in Weight was reported by patient. Height was reported by patient. Body Surface Area: 2.11 m Body Mass Index: 26.18 kg/m Pulse: 76 (Regular) Resp.: 14 (Unlabored) BP: 140/72 (Sitting, Left Arm, Standard)   Physical Exam The physical exam findings are as follows:   General Mental Status - Alert, cooperative and good historian. General Appearance- pleasant. Not in acute distress. Orientation- Oriented X3. Build & Nutrition- Well nourished and Well developed.   Head and Neck Head- normocephalic, atraumatic . Neck Global Assessment- supple. no bruit auscultated on the right and no bruit auscultated on the left.   Eye Pupil- Bilateral- Regular and Round. Motion- Bilateral- EOMI.   Chest and Lung Exam Auscultation: Breath sounds:- clear at anterior chest wall and - clear at posterior chest wall. Adventitious sounds:- No Adventitious sounds.   Cardiovascular Auscultation:Rhythm- Regular rate and rhythm. Heart Sounds- S1 WNL and S2 WNL. Murmurs & Other Heart Sounds:Auscultation of the heart reveals - No Murmurs.   Abdomen Palpation/Percussion:Tenderness- Abdomen is non-tender to palpation. Rigidity (guarding)- Abdomen is soft. Auscultation:Auscultation of the abdomen reveals - Bowel sounds  normal.   Male Genitourinary Not done, not pertinent to present illness  Musculoskeletal On exam, he is alert and oriented in no apparent distress. both knees show no effusion. Range is about 5-125 on each side. The left is tender medially. The right is tender laterally. There is no instability noted.  RADIOGRAPHS: Radiographs showed bone on bone arthritis in the medial and patellofemoral compartments of the left knee and bone on bone arthritis in the lateral patellofemoral compartments of the right knee.   Assessment & Plan Osteoarthritis of both knees (715.96)  Note: Plan is for a Bilateral Total Knee Replacement by Dr. Wynelle Link.  Plan is to go to rehab.  PCP - Dr. Carlus Pavlov - Patient has been seen preoperatively and felt to be stable for surgery.  The patient will not receive TXA (tranexamic acid) due to: Previous DVT following knee scope.  Signed electronically by Joelene Millin, III PA-C

## 2013-07-08 NOTE — Transfer of Care (Signed)
Immediate Anesthesia Transfer of Care Note  Patient: Jorge Gray  Procedure(s) Performed: Procedure(s): TOTAL KNEE BILATERAL (Bilateral)  Patient Location: PACU  Anesthesia Type:Epidural  Level of Consciousness: awake, alert  and oriented  Airway & Oxygen Therapy: Patient Spontanous Breathing and Patient connected to nasal cannula oxygen  Post-op Assessment: Report given to PACU RN and Post -op Vital signs reviewed and stable  Post vital signs: Reviewed and stable  Complications: No apparent anesthesia complications

## 2013-07-08 NOTE — Op Note (Signed)
Pre-operative diagnosis- Osteoarthritis  Bilateral knee(s)  Post-operative diagnosis- Osteoarthritis Bilateral knee(s)  Procedure-  Bilateral  Total Knee Arthroplasty  Surgeon- Dione Plover. Elye Harmsen, MD  Assistant- Arlee Muslim, PA-C   Anesthesia-  Epidural EBL-* No blood loss amount entered *  Drains Hemovac x 1 each side  Tourniquet time-  Total Tourniquet Time Documented: Thigh (Left) - 35 minutes Total: Thigh (Left) - 35 minutes  Thigh (Right) - 40 minutes Total: Thigh (Right) - 40 minutes    Complications- None  Condition-PACU - hemodynamically stable.   Brief Clinical Note  Jorge Gray is a 68 y.o. year old male with end stage OA of his right knee with progressively worsening pain and dysfunction. He has constant pain, with activity and at rest and significant functional deficits with difficulties even with ADLs. He has had extensive non-op management including analgesics, injections of cortisone and viscosupplements, and home exercise program, but remains in significant pain with significant dysfunction. Radiographs show bone on bone arthritis medial and patellofemoral on left and lateral and patellofemoral on right. He was given the option of doing both knees in the same setting versus one at a time. We discussed procedure, risks, potential complications, pros, cons and rehab course associated with each option and he elected to do both at the same setting.  ,  He presents now for bilateral Total Knee Arthroplasty.    Procedure in detail---   The patient is brought into the operating room and positioned supine on the operating table. After successful administration of  Epidural,   a tourniquet is placed high on the  Bilateral thigh(s) and the lower extremity is prepped and draped in the usual sterile fashion. Time out is performed by the operating team and then the  Left lower extremity is wrapped in Esmarch, knee flexed and the tourniquet inflated to 300 mmHg.       A  midline incision is made with a ten blade through the subcutaneous tissue to the level of the extensor mechanism. A fresh blade is used to make a medial parapatellar arthrotomy. Soft tissue over the proximal medial tibia is subperiosteally elevated to the joint line with a knife and into the semimembranosus bursa with a Cobb elevator. Soft tissue over the proximal lateral tibia is elevated with attention being paid to avoiding the patellar tendon on the tibial tubercle. The patella is everted, knee flexed 90 degrees and the ACL and PCL are removed. Findings are bone on bone medial and patellofemoral with large medial osteophytes.        The drill is used to create a starting hole in the distal femur and the canal is thoroughly irrigated with sterile saline to remove the fatty contents. The 5 degree Left  valgus alignment guide is placed into the femoral canal and the distal femoral cutting block is pinned to remove 9 mm off the distal femur. Resection is made with an oscillating saw.      The tibia is subluxed forward and the menisci are removed. The extramedullary alignment guide is placed referencing proximally at the medial aspect of the tibial tubercle and distally along the second metatarsal axis and tibial crest. The block is pinned to remove 11mm off the more deficient medial  side. Resection is made with an oscillating saw. Size 8is the most appropriate size for the tibia and the proximal tibia is prepared with the modular drill and keel punch for that size.      The femoral sizing guide is placed and  size 7 is most appropriate. Rotation is marked off the epicondylar axis and confirmed by creating a rectangular flexion gap at 90 degrees. The size 7 cutting block is pinned in this rotation and the anterior, posterior and chamfer cuts are made with the oscillating saw. The intercondylar block is then placed and that cut is made.      Trial size 8 tibial component, trial size 7 posterior stabilized femur and  a 8  mm posterior stabilized rotating platform insert trial is placed. Full extension is achieved with excellent varus/valgus and anterior/posterior balance throughout full range of motion. The patella is everted and thickness measured to be 27  mm. Free hand resection is taken to 15 mm, a 41 template is placed, lug holes are drilled, trial patella is placed, and it tracks normally. Osteophytes are removed off the posterior femur with the trial in place. All trials are removed and the cut bone surfaces prepared with pulsatile lavage. Cement is mixed and once ready for implantation, the size 8 tibial implant, size  7 posterior stabilized femoral component, and the size 41 patella are cemented in place and the patella is held with the clamp. The trial insert is placed and the knee held in full extension.  All extruded cement is removed and once the cement is hard the permanent 8 mm posterior stabilized rotating platform insert is placed into the tibial tray.      The wound is copiously irrigated with saline solution and the extensor mechanism closed over a hemovac drain with #1 PDS suture. The tourniquet is released for a total tourniquet time of 35  minutes. Flexion against gravity is 140 degrees and the patella tracks normally. Subcutaneous tissue is closed with 2.0 vicryl and subcuticular with running 4.0 Monocryl. The right leg is then addressed       the  Right lower extremity is wrapped in Esmarch, knee flexed and the tourniquet inflated to 300 mmHg.       A midline incision is made with a ten blade through the subcutaneous tissue to the level of the extensor mechanism. A fresh blade is used to make a medial parapatellar arthrotomy. Soft tissue over the proximal medial tibia is subperiosteally elevated to the joint line with a knife and into the semimembranosus bursa with a Cobb elevator. Soft tissue over the proximal lateral tibia is elevated with attention being paid to avoiding the patellar tendon on the  tibial tubercle. The patella is everted, knee flexed 90 degrees and the ACL and PCL are removed. Findings are bone on bone lateral and patellofemoral .        The drill is used to create a starting hole in the distal femur and the canal is thoroughly irrigated with sterile saline to remove the fatty contents. The 5 degree Right  valgus alignment guide is placed into the femoral canal and the distal femoral cutting block is pinned to remove 10 mm off the distal femur. Resection is made with an oscillating saw.      The tibia is subluxed forward and the menisci are removed. The extramedullary alignment guide is placed referencing proximally at the medial aspect of the tibial tubercle and distally along the second metatarsal axis and tibial crest. The block is pinned to remove 99mm off the more deficient lateral  side. Resection is made with an oscillating saw. Size 8is the most appropriate size for the tibia and the proximal tibia is prepared with the modular drill and keel punch for that  size.      The femoral sizing guide is placed and size 7 is most appropriate. Rotation is marked off the epicondylar axis and confirmed by creating a rectangular flexion gap at 90 degrees. The size 7 cutting block is pinned in this rotation and the anterior, posterior and chamfer cuts are made with the oscillating saw. The intercondylar block is then placed and that cut is made.      Trial size 8 tibial component, trial size 7 posterior stabilized femur and a 8  mm posterior stabilized rotating platform insert trial is placed. Full extension is achieved with excellent varus/valgus and anterior/posterior balance throughout full range of motion. The patella is everted and thickness measured to be 27  mm. Free hand resection is taken to 15 mm, a 41 template is placed, lug holes are drilled, trial patella is placed, and it tracks normally. Osteophytes are removed off the posterior femur with the trial in place. All trials are removed  and the cut bone surfaces prepared with pulsatile lavage. Cement is mixed and once ready for implantation, the size 8 tibial implant, size  7 posterior stabilized femoral component, and the size 41 patella are cemented in place and the patella is held with the clamp. The trial insert is placed and the knee held in full extension.  All extruded cement is removed and once the cement is hard the permanent 8 mm posterior stabilized rotating platform insert is placed into the tibial tray.      The wound is copiously irrigated with saline solution and the extensor mechanism closed over a hemovac drain with #1 PDS suture. The tourniquet is released for a total tourniquet time of 42  minutes. Flexion against gravity is 140 degrees and the patella tracks normally. Subcutaneous tissue is closed with 2.0 vicryl and subcuticular with running 4.0 Monocryl. The incisions are cleaned and dried and steri-strips and a bulky sterile dressings are applied. The limbs are placed into knee immobilizers and the patient is awakened and transported to recovery in stable condition.       Please note that a surgical assistant was a medical necessity for this procedure in order to perform it in a safe and expeditious manner. Surgical assistant was necessary to retract the ligaments and vital neurovascular structures to prevent injury to them and also necessary for proper positioning of the limb to allow for anatomic placement of the prosthesis.   Dione Plover Schon Zeiders, MD    07/08/2013, 3:33 PM

## 2013-07-08 NOTE — Plan of Care (Signed)
Problem: Consults Goal: Diagnosis- Total Joint Replacement Bilateral total knee

## 2013-07-08 NOTE — Anesthesia Postprocedure Evaluation (Signed)
Anesthesia Post Note  Patient: Jorge Gray  Procedure(s) Performed: Procedure(s) (LRB): TOTAL KNEE BILATERAL (Bilateral)  Anesthesia type: Spinal  Patient location: PACU  Post pain: Pain level controlled  Post assessment: Post-op Vital signs reviewed  Last Vitals:  Filed Vitals:   07/08/13 1615  BP: 110/60  Pulse: 77  Temp:   Resp: 15    Post vital signs: Reviewed  Level of consciousness: sedated  Complications: No apparent anesthesia complications

## 2013-07-08 NOTE — Progress Notes (Signed)
Dr Tommie Ard authorized transfer to floor

## 2013-07-08 NOTE — Anesthesia Preprocedure Evaluation (Addendum)
Anesthesia Evaluation  Patient identified by MRN, date of birth, ID band Patient awake    Reviewed: Allergy & Precautions, H&P , NPO status , Patient's Chart, lab work & pertinent test results  Airway Mallampati: II TM Distance: >3 FB Neck ROM: Full    Dental  (+) Teeth Intact, Dental Advisory Given   Pulmonary neg pulmonary ROS, asthma , former smoker,    Pulmonary exam normal       Cardiovascular hypertension, Pt. on medications negative cardio ROS  Rhythm:Regular Rate:Normal     Neuro/Psych negative neurological ROS  negative psych ROS   GI/Hepatic negative GI ROS, Neg liver ROS,   Endo/Other  negative endocrine ROS  Renal/GU negative Renal ROS  negative genitourinary   Musculoskeletal negative musculoskeletal ROS (+)   Abdominal   Peds  Hematology negative hematology ROS (+)   Anesthesia Other Findings   Reproductive/Obstetrics                          Anesthesia Physical Anesthesia Plan  ASA: II  Anesthesia Plan: Epidural   Post-op Pain Management:    Induction: Intravenous  Airway Management Planned: Simple Face Mask  Additional Equipment:   Intra-op Plan:   Post-operative Plan:   Informed Consent: I have reviewed the patients History and Physical, chart, labs and discussed the procedure including the risks, benefits and alternatives for the proposed anesthesia with the patient or authorized representative who has indicated his/her understanding and acceptance.   Dental advisory given  Plan Discussed with: CRNA  Anesthesia Plan Comments:         Anesthesia Quick Evaluation

## 2013-07-08 NOTE — Anesthesia Procedure Notes (Signed)
Epidural Patient location during procedure: holding area Start time: 07/08/2013 12:32 PM End time: 07/08/2013 12:38 PM  Staffing Anesthesiologist: Freddie Apley F Performed by: anesthesiologist   Preanesthetic Checklist Completed: patient identified, site marked, surgical consent, pre-op evaluation, timeout performed, IV checked, risks and benefits discussed, monitors and equipment checked and post-op pain management  Epidural Patient position: sitting Prep: Betadine Patient monitoring: heart rate, continuous pulse ox and blood pressure Approach: midline Injection technique: LOR air  Needle:  Needle type: Tuohy  Needle gauge: 18 G Needle length: 9 cm and 9 Catheter type: closed end flexible Catheter size: 20 Guage Test dose: negative and 1.5% lidocaine  Additional Notes Test dose 1.5% Lidocaine with epi 1:200,000  Patient tolerated the insertion well without complications.Reason for block:post-op pain management

## 2013-07-09 LAB — BASIC METABOLIC PANEL
BUN: 17 mg/dL (ref 6–23)
CHLORIDE: 103 meq/L (ref 96–112)
CO2: 24 mEq/L (ref 19–32)
CREATININE: 0.77 mg/dL (ref 0.50–1.35)
Calcium: 8.4 mg/dL (ref 8.4–10.5)
GFR calc Af Amer: >90 mL/min (ref >90)
GFR calc non Af Amer: >90 mL/min (ref >90)
Glucose, Bld: 173 mg/dL — ABNORMAL HIGH (ref 70–99)
POTASSIUM: 4 meq/L (ref 3.7–5.3)
Sodium: 137 mEq/L (ref 137–147)

## 2013-07-09 LAB — CBC
HCT: 36.5 % — ABNORMAL LOW (ref 39.0–52.0)
HEMOGLOBIN: 11.9 g/dL — AB (ref 13.0–17.0)
MCH: 26.8 pg (ref 26.0–34.0)
MCHC: 32.6 g/dL (ref 30.0–36.0)
MCV: 82.2 fL (ref 78.0–100.0)
Platelets: 172 10*3/uL (ref 150–400)
RBC: 4.44 MIL/uL (ref 4.22–5.81)
RDW: 13.5 % (ref 11.5–15.5)
WBC: 12.7 10*3/uL — ABNORMAL HIGH (ref 4.0–10.5)

## 2013-07-09 LAB — PROTIME-INR
INR: 1.38 (ref 0.00–1.49)
Prothrombin Time: 16.6 seconds — ABNORMAL HIGH (ref 11.6–15.2)

## 2013-07-09 MED ORDER — WARFARIN - PHARMACIST DOSING INPATIENT: Freq: Every day | Status: DC

## 2013-07-09 NOTE — Progress Notes (Signed)
Pain level well controlled with epidural. Patient ambulating earlier this morning.  Discussed plan of care. Questions answered.

## 2013-07-09 NOTE — Progress Notes (Signed)
Physical Therapy Treatment Patient Details Name: Jorge Gray MRN: [id number] DOB: 1946-03-22 Today's Date: 07/09/2013 Time: 8101-7510 PT Time Calculation (min): 23 min  PT Assessment / Plan / Recommendation  History of Present Illness Bil TKR   PT Comments     Follow Up Recommendations  CIR     Does the patient have the potential to tolerate intense rehabilitation     Barriers to Discharge        Equipment Recommendations  None recommended by PT    Recommendations for Other Services OT consult  Frequency 7X/week   Progress towards PT Goals Progress towards PT goals: Progressing toward goals  Plan Current plan remains appropriate    Precautions / Restrictions Precautions Precautions: Knee;Fall Required Braces or Orthoses: Knee Immobilizer - Right;Knee Immobilizer - Left Knee Immobilizer - Right: Discontinue once straight leg raise with < 10 degree lag Knee Immobilizer - Left: Discontinue once straight leg raise with < 10 degree lag Restrictions Weight Bearing Restrictions: No Other Position/Activity Restrictions: WBAT   Pertinent Vitals/Pain 4-5/10; premed, ice packs provided    Mobility  Bed Mobility Overal bed mobility: Needs Assistance Bed Mobility: Sit to Supine Supine to sit: Mod assist Sit to supine: Mod assist General bed mobility comments: cues for technique and assist for Bil LE Transfers Overall transfer level: Needs assistance Equipment used: Rolling walker (2 wheeled) Transfers: Sit to/from Stand Sit to Stand: +2 physical assistance;Mod assist;From elevated surface General transfer comment: cues for LE management and use of UEs to self assist Ambulation/Gait Ambulation/Gait assistance: +2 safety/equipment;+2 physical assistance;Mod assist Ambulation Distance (Feet): 20 Feet Assistive device: Rolling walker (2 wheeled) Gait Pattern/deviations: Step-to pattern;Decreased step length - right;Decreased step length - left;Shuffle;Trunk flexed Gait  velocity: decr General Gait Details: cues for posture, sequence and position from RW    Exercises     PT Diagnosis:    PT Problem List:   PT Treatment Interventions:     PT Goals (current goals can now be found in the care plan section) Acute Rehab PT Goals Patient Stated Goal: Resume previous lifestyle with decreased pain PT Goal Formulation: With patient Time For Goal Achievement: 07/16/13 Potential to Achieve Goals: Good  Visit Information  Last PT Received On: 07/09/13 Assistance Needed: +2 PT/OT/SLP Co-Evaluation/Treatment: Yes Reason for Co-Treatment: For patient/therapist safety PT goals addressed during session: Mobility/safety with mobility OT goals addressed during session: ADL's and self-care;Proper use of Adaptive equipment and DME History of Present Illness: Bil TKR    Subjective Data  Patient Stated Goal: Resume previous lifestyle with decreased pain   Cognition  Cognition Arousal/Alertness: Awake/alert Behavior During Therapy: WFL for tasks assessed/performed Overall Cognitive Status: Within Functional Limits for tasks assessed    Balance     End of Session PT - End of Session Equipment Utilized During Treatment: Gait belt;Right knee immobilizer;Left knee immobilizer Activity Tolerance: Patient tolerated treatment well Patient left: in bed;with call bell/phone within reach;with family/visitor present Nurse Communication: Mobility status   GP     Jorge Gray 07/09/2013, 3:57 PM

## 2013-07-09 NOTE — Evaluation (Signed)
Physical Therapy Evaluation Patient Details Name: Jorge Gray MRN: [id number] DOB: 1945/08/16 Today's Date: 07/09/2013 Time: 0817-0905 PT Time Calculation (min): 48 min  PT Assessment / Plan / Recommendation History of Present Illness  Bil TKR  Clinical Impression  Pt s/p Bil TKR presents with decreased Bil LE strength/ROM and post op pain limiting functional mobility.  Pt would benefit from CIR level rehab to maximize IND and safety for return home with limited assist.    PT Assessment  Patient needs continued PT services    Follow Up Recommendations  CIR    Does the patient have the potential to tolerate intense rehabilitation      Barriers to Discharge        Equipment Recommendations  None recommended by PT    Recommendations for Other Services OT consult   Frequency 7X/week    Precautions / Restrictions Precautions Precautions: Knee;Fall Required Braces or Orthoses: Knee Immobilizer - Right;Knee Immobilizer - Left Knee Immobilizer - Right: Discontinue once straight leg raise with < 10 degree lag Knee Immobilizer - Left: Discontinue once straight leg raise with < 10 degree lag Restrictions Weight Bearing Restrictions: No Other Position/Activity Restrictions: WBAT   Pertinent Vitals/Pain 4/10; premed, epidural in place, cold packs provided      Mobility  Bed Mobility Overal bed mobility: +2 for physical assistance;Needs Assistance Bed Mobility: Supine to Sit Supine to sit: +2 for physical assistance;Mod assist General bed mobility comments: cues for sequence and assist for Bil LEs and intial balance at bedside Transfers Overall transfer level: Needs assistance Equipment used: Rolling walker (2 wheeled) Transfers: Sit to/from Stand Sit to Stand: +2 physical assistance;Mod assist;From elevated surface General transfer comment: cues for LE management and use of UEs to self assist Ambulation/Gait Ambulation/Gait assistance: +2 physical assistance;Mod  assist Ambulation Distance (Feet): 16 Feet Assistive device: Rolling walker (2 wheeled) Gait Pattern/deviations: Step-to pattern;Decreased step length - right;Decreased step length - left;Shuffle;Antalgic;Trunk flexed Gait velocity: decr General Gait Details: cues for posture, sequence and position from RW    Exercises Total Joint Exercises Ankle Circles/Pumps: AROM;Both;15 reps;Supine Quad Sets: AROM;Both;10 reps;Supine Heel Slides: AAROM;10 reps;Supine;Both Straight Leg Raises: AAROM;Both;10 reps;Supine   PT Diagnosis: Difficulty walking  PT Problem List: Decreased strength;Decreased range of motion;Decreased activity tolerance;Decreased mobility;Decreased knowledge of use of DME;Pain PT Treatment Interventions: DME instruction;Gait training;Stair training;Functional mobility training;Therapeutic activities;Therapeutic exercise;Patient/family education     PT Goals(Current goals can be found in the care plan section) Acute Rehab PT Goals Patient Stated Goal: Resume previous lifestyle with decreased pain PT Goal Formulation: With patient Time For Goal Achievement: 07/16/13 Potential to Achieve Goals: Good  Visit Information  Last PT Received On: 07/09/13 Assistance Needed: +2 History of Present Illness: Bil TKR       Prior Functioning  Home Living Family/patient expects to be discharged to:: Inpatient rehab Living Arrangements: Spouse/significant other Prior Function Level of Independence: Independent Communication Communication: No difficulties Dominant Hand: Right    Cognition  Cognition Arousal/Alertness: Awake/alert Behavior During Therapy: WFL for tasks assessed/performed Overall Cognitive Status: Within Functional Limits for tasks assessed    Extremity/Trunk Assessment Upper Extremity Assessment Upper Extremity Assessment: Overall WFL for tasks assessed Lower Extremity Assessment Lower Extremity Assessment: RLE deficits/detail;LLE deficits/detail RLE  Deficits / Details: 2+/5 quads with AAROM at knee -10 - 100 LLE Deficits / Details: 2/5 quads with AAROM at knee -10 - 95 Cervical / Trunk Assessment Cervical / Trunk Assessment: Normal   Balance    End of Session PT - End of  Session Equipment Utilized During Treatment: Gait belt;Right knee immobilizer;Left knee immobilizer Activity Tolerance: Patient tolerated treatment well Patient left: in chair;with call bell/phone within reach;with family/visitor present Nurse Communication: Mobility status CPM Left Knee CPM Left Knee: Off CPM Right Knee CPM Right Knee: Off  GP     Hillary Struss 07/09/2013, 12:33 PM

## 2013-07-09 NOTE — Progress Notes (Signed)
   Subjective: 1 Day Post-Op Procedure(s) (LRB): TOTAL KNEE BILATERAL (Bilateral) Patient reports pain as mild.  Epidural and oxycodone working well We will start therapy today.  Plan is to go Rehab after hospital stay.  Objective: Vital signs in last 24 hours: Temp:  [97.4 F (36.3 C)-98.5 F (36.9 C)] 98.4 F (36.9 C) (03/07 0100) Pulse Rate:  [66-94] 86 (03/07 0100) Resp:  [12-22] 18 (03/07 0100) BP: (65-172)/(42-121) 96/60 mmHg (03/07 0100) SpO2:  [92 %-100 %] 100 % (03/07 0100) Weight:  [198 lb (89.812 kg)] 198 lb (89.812 kg) (03/06 1730)  Intake/Output from previous day:  Intake/Output Summary (Last 24 hours) at 07/09/13 0908 Last data filed at 07/09/13 0500  Gross per 24 hour  Intake 6142.33 ml  Output   3420 ml  Net 2722.33 ml    Intake/Output this shift:    Labs:  Recent Labs  07/09/13 0418  HGB 11.9*    Recent Labs  07/09/13 0418  WBC 12.7*  RBC 4.44  HCT 36.5*  PLT 172    Recent Labs  07/09/13 0418  NA 137  K 4.0  CL 103  CO2 24  BUN 17  CREATININE 0.77  GLUCOSE 173*  CALCIUM 8.4   No results found for this basename: LABPT, INR,  in the last 72 hours  EXAM General - Patient is Alert, Appropriate and Oriented Extremity - Neurologically intact ABD soft Neurovascular intact No cellulitis present Compartment soft Dressing - dressing C/D/I Motor Function - intact, moving foot and toes well on exam.  Hemovac pulled without difficulty.  Past Medical History  Diagnosis Date  . Hypercholesteremia   . Hypertension   . DDD (degenerative disc disease)   . Eczema   . Asthma     as child  . Rash     abd  . History of nonmelanoma skin cancer     basel cell removed from scalp    Assessment/Plan: 1 Day Post-Op Procedure(s) (LRB): TOTAL KNEE BILATERAL (Bilateral) Principal Problem:   OA (osteoarthritis) of knee   Advance diet Up with therapy Epidural out tomorrow Begin Lovenox 12 hours after epidural pulled Foley out AM  3/9  DVT Prophylaxis - Coumadin (Add Lovenox 12 hours after epidural D/C'ed) Weight-Bearing as tolerated to bilateral legs D/C O2 and Pulse OX and try on Room Air  Jorge Gray V 07/09/2013, 9:08 AM

## 2013-07-09 NOTE — Evaluation (Signed)
Occupational Therapy Evaluation Patient Details Name: Jorge Gray MRN: [id number] DOB: 1946/04/23 Today's Date: 07/09/2013 Time: 3785-8850 OT Time Calculation (min): 22 min  OT Assessment / Plan / Recommendation History of present illness Bil TKR   Clinical Impression   Pt was admitted for the above surgeries.  He will benefit from skilled OT to increase safety and independence with adls. Pt is an excellent candidate for CIR.  Will follow in acute.  Goals in acute are min A to min guard for selected ADLs/toilet transfers.      OT Assessment  Patient needs continued OT Services    Follow Up Recommendations  CIR    Barriers to Discharge      Equipment Recommendations  3 in 1 bedside comode    Recommendations for Other Services    Frequency  Min 2X/week    Precautions / Restrictions Precautions Precautions: Knee;Fall Required Braces or Orthoses: Knee Immobilizer - Right;Knee Immobilizer - Left Knee Immobilizer - Right: Discontinue once straight leg raise with < 10 degree lag Knee Immobilizer - Left: Discontinue once straight leg raise with < 10 degree lag Restrictions Other Position/Activity Restrictions: WBAT   Pertinent Vitals/Pain bil LE pain with weight bearing.  Not rated.  Repositioned in bed.      ADL  Toilet Transfer: Simulated;+2 Total assistance;Moderate assistance Toilet Transfer Method: Sit to stand Equipment Used: Rolling walker Transfers/Ambulation Related to ADLs: Simulated toilet transfer.  Cues for technique for sit to stand and for hand placement ADL Comments: Educated on AE but pt did not use.  Pt can perform UB adls with set up and LB ADLs require assist x 2 for safety.  Bathing, pt 50%; dressing, pt 20% without AE.  Pt can use AE for bathing at this time and will benefit from AE for dressing as LE strength improves    OT Diagnosis: Generalized weakness  OT Problem List: Decreased strength;Decreased activity tolerance;Decreased knowledge of use of  DME or AE;Pain OT Treatment Interventions: Self-care/ADL training;DME and/or AE instruction;Patient/family education   OT Goals(Current goals can be found in the care plan section) Acute Rehab OT Goals Patient Stated Goal: Resume previous lifestyle with decreased pain OT Goal Formulation: With patient Time For Goal Achievement: 07/16/13 Potential to Achieve Goals: Good ADL Goals Pt Will Perform Grooming: with min guard assist;standing Pt Will Perform Lower Body Bathing: with adaptive equipment;sit to/from stand;with min assist Pt Will Transfer to Toilet: with min guard assist;ambulating;bedside commode Pt Will Perform Toileting - Clothing Manipulation and hygiene: with min guard assist;sit to/from stand  Visit Information  Last OT Received On: 07/09/13 Assistance Needed: +2 PT/OT/SLP Co-Evaluation/Treatment: Yes Reason for Co-Treatment: For patient/therapist safety PT goals addressed during session: Mobility/safety with mobility OT goals addressed during session: ADL's and self-care;Proper use of Adaptive equipment and DME History of Present Illness: Bil TKR       Prior Functioning     Home Living Family/patient expects to be discharged to:: Inpatient rehab Living Arrangements: Spouse/significant other Prior Function Level of Independence: Independent Communication Communication: No difficulties Dominant Hand: Right         Vision/Perception     Cognition  Cognition Arousal/Alertness: Awake/alert Behavior During Therapy: WFL for tasks assessed/performed Overall Cognitive Status: Within Functional Limits for tasks assessed    Extremity/Trunk Assessment Upper Extremity Assessment Upper Extremity Assessment: Overall WFL for tasks assessed     Mobility Bed Mobility Supine to sit: Mod assist General bed mobility comments: cues for technique and assist for Bil LE Transfers Transfers: Sit  to/from Stand Sit to Stand: +2 physical assistance;Mod assist;From elevated  surface General transfer comment: cues for LE management and use of UEs to self assist     Exercise     Balance     End of Session OT - End of Session Activity Tolerance: Patient tolerated treatment well Patient left: in bed;with call bell/phone within reach;with family/visitor present  [place] 07/09/2013, 2:58 PM Lesle Chris, OTR/L [phone] 07/09/2013

## 2013-07-09 NOTE — Progress Notes (Addendum)
ANTICOAGULATION CONSULT NOTE - Follow up  Pharmacy Consult for Warfarin Indication: VTE prophylaxis  No Known Allergies  Patient Measurements: Height: 6' (182.9 cm) Weight: 198 lb (89.812 kg) IBW/kg (Calculated) : 77.6  Vital Signs: Temp: 99.1 F (37.3 C) (03/07 0935) Temp src: Oral (03/07 0935) BP: 121/68 mmHg (03/07 0935) Pulse Rate: 96 (03/07 0935)  Labs:  Recent Labs  07/09/13 0418 07/09/13 1026  HGB 11.9*  --   HCT 36.5*  --   PLT 172  --   LABPROT  --  16.6*  INR  --  1.38  CREATININE 0.77  --     Estimated Creatinine Clearance: 99.7 ml/min (by C-G formula based on Cr of 0.77).  Assessment: 67 yoM s/p bilateral total knee arthroplasty 3/6.  Pharmacy consulted to begin warfarin post-op for VTE prophylaxis.  No other anticoagulants ordered.  Patient has epidural in place.  INR slightly elevated after initial 4mg  low-dose given 3/6.  Plan is to keep epidural in place until 3/8. Then start Lovenox bridging 12hrs after it is removed.  Goal of Therapy:  INR 2-3 Monitor platelets by anticoagulation protocol: Yes   Plan:   No warfarin today. No further doses of warfarin until the epidural is removed (BPA).  Warfarin education completed.  Daily INR.  Romeo Rabon, PharmD, pager [phone]. 07/09/2013,11:00 AM.

## 2013-07-09 NOTE — Addendum Note (Signed)
Addendum created 07/09/13 1132 by Rudean Curt, MD   Modules edited: Clinical Notes   Clinical Notes:  File: [id number]

## 2013-07-09 NOTE — Progress Notes (Signed)
UR completed 

## 2013-07-10 LAB — BASIC METABOLIC PANEL
BUN: 16 mg/dL (ref 6–23)
CO2: 26 mEq/L (ref 19–32)
CREATININE: 0.81 mg/dL (ref 0.50–1.35)
Calcium: 8.7 mg/dL (ref 8.4–10.5)
Chloride: 102 mEq/L (ref 96–112)
Glucose, Bld: 134 mg/dL — ABNORMAL HIGH (ref 70–99)
Potassium: 4.4 mEq/L (ref 3.7–5.3)
Sodium: 138 mEq/L (ref 137–147)

## 2013-07-10 LAB — CBC
HCT: 33.7 % — ABNORMAL LOW (ref 39.0–52.0)
Hemoglobin: 11.2 g/dL — ABNORMAL LOW (ref 13.0–17.0)
MCH: 27.3 pg (ref 26.0–34.0)
MCHC: 33.2 g/dL (ref 30.0–36.0)
MCV: 82 fL (ref 78.0–100.0)
Platelets: 195 10*3/uL (ref 150–400)
RBC: 4.11 MIL/uL — ABNORMAL LOW (ref 4.22–5.81)
RDW: 14 % (ref 11.5–15.5)
WBC: 17.8 10*3/uL — ABNORMAL HIGH (ref 4.0–10.5)

## 2013-07-10 LAB — PROTIME-INR
INR: 1.7 — ABNORMAL HIGH (ref 0.00–1.49)
INR: 1.57 — AB (ref 0.00–1.49)
Prothrombin Time: 19.5 seconds — ABNORMAL HIGH (ref 11.6–15.2)
Prothrombin Time: 18.3 seconds — ABNORMAL HIGH (ref 11.6–15.2)

## 2013-07-10 MED ORDER — VITAMIN K1 10 MG/ML IJ SOLN
1.0000 mg | Freq: Once | INTRAVENOUS | Status: AC
  Administered 2013-07-10: 1 mg via INTRAVENOUS
  Filled 2013-07-10: qty 0.1

## 2013-07-10 MED ORDER — WARFARIN SODIUM 2.5 MG PO TABS
2.5000 mg | ORAL_TABLET | Freq: Once | ORAL | Status: DC
  Filled 2013-07-10: qty 1

## 2013-07-10 NOTE — Progress Notes (Signed)
ANTICOAGULATION CONSULT NOTE - Follow up  Pharmacy Consult for Warfarin Indication: VTE prophylaxis  No Known Allergies  Patient Measurements: Height: 6' (182.9 cm) Weight: 198 lb (89.812 kg) IBW/kg (Calculated) : 77.6  Vital Signs: Temp: 98.5 F (36.9 C) (03/08 1700) Temp src: Oral (03/08 1700) BP: 190/85 mmHg (03/08 1700) Pulse Rate: 95 (03/08 1700)  Labs:  Recent Labs  07/09/13 0418 07/09/13 1026 07/10/13 0505 07/10/13 1803  HGB 11.9*  --  11.2*  --   HCT 36.5*  --  33.7*  --   PLT 172  --  195  --   LABPROT  --  16.6* 19.5* 18.3*  INR  --  1.38 1.70* 1.57*  CREATININE 0.77  --  0.81  --     Estimated Creatinine Clearance: 98.5 ml/min (by C-G formula based on Cr of 0.81).  Assessment: 89 yoM s/p bilateral total knee arthroplasty 3/6.  Pharmacy consulted to begin warfarin post-op for VTE prophylaxis.  No other anticoagulants ordered.  Patient has epidural in place.  INR slightly elevated after initial 4mg  low-dose given 3/6 and dose not given on 3/7 after rise again in INR  Vitamin K IV given 3/8 and INR was 1.57 at 630pm and epidural was pulled at 1925 to begin warfarin again tonight  Start Lovenox bridging 12hrs after it is removed.  Goal of Therapy:  INR 2-3 Monitor platelets by anticoagulation protocol: Yes   Plan:  1) Warfarin 2.5mg  tonight 2) Daily INR   Adrian Saran, PharmD, BCPS Pager [phone] 07/10/2013 7:51 PM

## 2013-07-10 NOTE — Progress Notes (Signed)
Epidural catheter discontinued and removed intact. Sterile dressing applied. INR prior to catheter removal 1.57. No complications noted.

## 2013-07-10 NOTE — Progress Notes (Signed)
Physical Therapy Treatment Patient Details Name: Jorge Gray MRN: [id number] DOB: February 24, 1946 Today's Date: 07/10/2013 Time: 4580-9983 PT Time Calculation (min): 16 min  PT Assessment / Plan / Recommendation  History of Present Illness 68 yo male s/p bil TKR   PT Comments   Progressing with mobility. Increased pain today per pt.   Follow Up Recommendations  CIR     Does the patient have the potential to tolerate intense rehabilitation     Barriers to Discharge        Equipment Recommendations  None recommended by PT    Recommendations for Other Services OT consult  Frequency 7X/week   Progress towards PT Goals Progress towards PT goals: Progressing toward goals  Plan Current plan remains appropriate    Precautions / Restrictions Precautions Precautions: Fall;Knee Required Braces or Orthoses: Knee Immobilizer - Right;Knee Immobilizer - Left Knee Immobilizer - Right: Discontinue once straight leg raise with < 10 degree lag Restrictions Weight Bearing Restrictions: No RLE Weight Bearing: Weight bearing as tolerated LLE Weight Bearing: Weight bearing as tolerated   Pertinent Vitals/Pain 7-8/10 bil knees. Ice applied end of session    Mobility  Bed Mobility Overal bed mobility: Needs Assistance Bed Mobility: Supine to Sit Supine to sit: Mod assist General bed mobility comments: cues for technique and assist for Bil LE Transfers Overall transfer level: Needs assistance Equipment used: Rolling walker (2 wheeled) Transfers: Sit to/from Stand Sit to Stand: From elevated surface;+2 physical assistance;+2 safety/equipment;Mod assist General transfer comment: cues for LE management and use of UEs to self assist. Assist to rise, stabilize, control descent.  Ambulation/Gait Ambulation/Gait assistance: +2 safety/equipment;+2 physical assistance;Mod assist Ambulation Distance (Feet): 58 Feet Assistive device: Rolling walker (2 wheeled) Gait Pattern/deviations: Step-to  pattern;Wide base of support;Trunk flexed;Decreased step length - left;Decreased step length - right;Decreased stride length    Exercises     PT Diagnosis:    PT Problem List:   PT Treatment Interventions:     PT Goals (current goals can now be found in the care plan section)    Visit Information  Last PT Received On: 07/10/13 Assistance Needed: +2 History of Present Illness: 68 yo male s/p bil TKR    Subjective Data      Cognition  Cognition Arousal/Alertness: Awake/alert Behavior During Therapy: WFL for tasks assessed/performed Overall Cognitive Status: Within Functional Limits for tasks assessed    Balance     End of Session PT - End of Session Equipment Utilized During Treatment: Right knee immobilizer;Left knee immobilizer Activity Tolerance: Patient limited by pain Patient left: in chair;with call bell/phone within reach   GP     Weston Anna, MPT Pager: [phone]

## 2013-07-10 NOTE — Progress Notes (Signed)
INR is 1.7 this am. Epidural is to be removed today. I spoke with [employer] RN and Merla Riches PA. I called the OR to speak with the anesthesiologist that will be rounding today. He was not in yet so I gave the OR staff a message for him to call me at [phone] before seeing the patient. There is also a sticky note asking the anesthesiologist to call pharmacy before pulling the epidural.  Romeo Rabon, PharmD, pager [phone]. 07/10/2013,8:14 AM.

## 2013-07-10 NOTE — Progress Notes (Signed)
Patient INR is 1.7 this AM following administration of Coumadin day of surgery. Spoke with Dr. Gilberto Better regarding administration of Vt. K and repeat INR prior to removal of epidural catheter planned for this evening.

## 2013-07-10 NOTE — Progress Notes (Signed)
Rehab Admissions Coordinator Note:  Patient was screened by Cleatrice Burke for appropriateness for an Inpatient Acute Rehab Consult.  At this time, we await completion of rehab consult on 07/11/13. Karene Fry will be the Admissions Coordinator to follow up on Monday. [phone].  Cleatrice Burke 07/10/2013, 5:30 PM  I can be reached at [phone].

## 2013-07-10 NOTE — Progress Notes (Signed)
Pt c/o feeling shaky & cold, elevated pain. VSS, skin w&d. Medicated pt for pain. Cayden Granholm, [employer]

## 2013-07-10 NOTE — Progress Notes (Signed)
Physical Therapy Treatment Patient Details Name: EQUAN COGBILL MRN: [id number] DOB: May 10, 1945 Today's Date: 07/10/2013 Time: 7829-5621 PT Time Calculation (min): 20 min  PT Assessment / Plan / Recommendation  History of Present Illness 68 yo male s/p bil TKR   PT Comments     Follow Up Recommendations  CIR     Does the patient have the potential to tolerate intense rehabilitation     Barriers to Discharge        Equipment Recommendations  None recommended by PT    Recommendations for Other Services OT consult  Frequency 7X/week   Progress towards PT Goals Progress towards PT goals: Progressing toward goals  Plan Current plan remains appropriate    Precautions / Restrictions Precautions Precautions: Fall;Knee Required Braces or Orthoses: Knee Immobilizer - Right;Knee Immobilizer - Left Knee Immobilizer - Right: Discontinue once straight leg raise with < 10 degree lag Knee Immobilizer - Left: Discontinue once straight leg raise with < 10 degree lag Restrictions Weight Bearing Restrictions: No RLE Weight Bearing: Weight bearing as tolerated LLE Weight Bearing: Weight bearing as tolerated   Pertinent Vitals/Pain 6/10 bil knees. Ice applied end of session    Mobility  Bed Mobility Overal bed mobility: Needs Assistance Bed Mobility: Sit to Supine Supine to sit: Mod assist Sit to supine: Mod assist General bed mobility comments: cues for technique and assist for Bil LE Transfers Overall transfer level: Needs assistance Equipment used: Rolling walker (2 wheeled) Transfers: Sit to/from Stand Sit to Stand: Mod assist;+2 safety/equipment;+2 physical assistance General transfer comment: cues for LE management and use of UEs to self assist. Assist to rise, stabilize, control descent.  Ambulation/Gait Ambulation/Gait assistance: +2 safety/equipment;+2 physical assistance;Min assist Ambulation Distance (Feet): 59 Feet Assistive device: Rolling walker (2 wheeled) Gait  Pattern/deviations: Step-to pattern;Wide base of support;Trunk flexed;Decreased step length - left;Decreased step length - right;Decreased stride length    Exercises     PT Diagnosis:    PT Problem List:   PT Treatment Interventions:     PT Goals (current goals can now be found in the care plan section)    Visit Information  Last PT Received On: 07/10/13 Assistance Needed: +2 History of Present Illness: 68 yo male s/p bil TKR    Subjective Data      Cognition  Cognition Arousal/Alertness: Awake/alert Behavior During Therapy: WFL for tasks assessed/performed Overall Cognitive Status: Within Functional Limits for tasks assessed    Balance     End of Session PT - End of Session Equipment Utilized During Treatment: Left knee immobilizer Activity Tolerance: Patient limited by pain Patient left: in bed;with call bell/phone within reach   GP     Weston Anna, MPT Pager: [phone]

## 2013-07-10 NOTE — Progress Notes (Signed)
Physical Therapy Treatment Patient Details Name: Jorge Gray MRN: [id number] DOB: 1946/03/23 Today's Date: 07/10/2013 Time: 1572-6203 PT Time Calculation (min): 23 min  PT Assessment / Plan / Recommendation  History of Present Illness 68 yo male s/p bil TKR   PT Comments   Progressing with mobility. Tolerated exercises well.   Follow Up Recommendations  CIR     Does the patient have the potential to tolerate intense rehabilitation     Barriers to Discharge        Equipment Recommendations  None recommended by PT    Recommendations for Other Services OT consult  Frequency 7X/week   Progress towards PT Goals Progress towards PT goals: Progressing toward goals  Plan Current plan remains appropriate    Precautions / Restrictions Precautions Precautions: Fall;Knee Required Braces or Orthoses: Knee Immobilizer - Left;Knee Immobilizer - Right Knee Immobilizer - Right: Discontinue once straight leg raise with < 10 degree lag Knee Immobilizer - Left: Discontinue once straight leg raise with < 10 degree lag Restrictions Weight Bearing Restrictions: No RLE Weight Bearing: Weight bearing as tolerated LLE Weight Bearing: Weight bearing as tolerated   Pertinent Vitals/Pain 2/10 bil knees. Ice applied end of session    Mobility    Exercises Total Joint Exercises Ankle Circles/Pumps: AROM;Both;15 reps;Supine Quad Sets: AROM;Both;10 reps;Supine Heel Slides: AAROM;Both;10 reps;Supine Hip ABduction/ADduction: AAROM;Both;10 reps;Supine Straight Leg Raises: AAROM;Both;10 reps;Supine Goniometric ROM: Right knee: 10-94 degrees; Left knee: 10-87 degrees   PT Diagnosis:    PT Problem List:   PT Treatment Interventions:     PT Goals (current goals can now be found in the care plan section)    Visit Information  Last PT Received On: 07/10/13 Assistance Needed: +2 History of Present Illness: 68 yo male s/p bil TKR    Subjective Data      Cognition   Cognition Arousal/Alertness: Awake/alert Behavior During Therapy: WFL for tasks assessed/performed Overall Cognitive Status: Within Functional Limits for tasks assessed    Balance     End of Session PT - End of Session Equipment Utilized During Treatment: Left knee immobilizer;Right knee immobilizer Activity Tolerance: Patient tolerated treatment well Patient left: in bed;with call bell/phone within reach;with family/visitor present   GP     Weston Anna, MPT Pager: [phone]

## 2013-07-10 NOTE — Addendum Note (Signed)
Addendum created 07/10/13 1957 by Ayesha Mohair, MD   Modules edited: Clinical Notes   Clinical Notes:  File: [id number]

## 2013-07-10 NOTE — Addendum Note (Signed)
Addendum created 07/10/13 1414 by Ayesha Mohair, MD   Modules edited: Clinical Notes   Clinical Notes:  File: [id number]

## 2013-07-10 NOTE — Progress Notes (Signed)
   Subjective: 2 Days Post-Op Procedure(s) (LRB): TOTAL KNEE BILATERAL (Bilateral)  Pt c/o moderate pain today, pain was better yesterday C/o soreness and ache to each knee Patient reports pain as moderate.  Objective:   VITALS:   Filed Vitals:   07/10/13 0621  BP: 181/90  Pulse: 75  Temp: 97.9 F (36.6 C)  Resp: 18    Bilateral knee incisions healing well Dressings changed nv intact distally No rashes or edema  LABS  Recent Labs  07/09/13 0418 07/10/13 0505  HGB 11.9* 11.2*  HCT 36.5* 33.7*  WBC 12.7* 17.8*  PLT 172 195     Recent Labs  07/09/13 0418 07/10/13 0505  NA 137 138  K 4.0 4.4  BUN 17 16  CREATININE 0.77 0.81  GLUCOSE 173* 134*     Assessment/Plan: 2 Days Post-Op Procedure(s) (LRB): TOTAL KNEE BILATERAL (Bilateral)  Continue PT/OT D/c planning  Pain control   Brad Dixon, MPAS, PA-C  07/10/2013, 7:39 AM

## 2013-07-11 ENCOUNTER — Encounter (HOSPITAL_COMMUNITY): Payer: Self-pay | Admitting: Orthopedic Surgery

## 2013-07-11 DIAGNOSIS — I10 Essential (primary) hypertension: Secondary | ICD-10-CM | POA: Diagnosis present

## 2013-07-11 DIAGNOSIS — M1710 Unilateral primary osteoarthritis, unspecified knee: Secondary | ICD-10-CM

## 2013-07-11 DIAGNOSIS — Z96659 Presence of unspecified artificial knee joint: Secondary | ICD-10-CM

## 2013-07-11 DIAGNOSIS — D62 Acute posthemorrhagic anemia: Secondary | ICD-10-CM | POA: Diagnosis not present

## 2013-07-11 DIAGNOSIS — E7800 Pure hypercholesterolemia, unspecified: Secondary | ICD-10-CM | POA: Diagnosis present

## 2013-07-11 LAB — BASIC METABOLIC PANEL
BUN: 12 mg/dL (ref 6–23)
CO2: 27 mEq/L (ref 19–32)
Calcium: 8.1 mg/dL — ABNORMAL LOW (ref 8.4–10.5)
Chloride: 101 mEq/L (ref 96–112)
Creatinine, Ser: 0.78 mg/dL (ref 0.50–1.35)
GFR calc Af Amer: >90 mL/min (ref >90)
GLUCOSE: 97 mg/dL (ref 70–99)
POTASSIUM: 3.8 meq/L (ref 3.7–5.3)
Sodium: 138 mEq/L (ref 137–147)

## 2013-07-11 LAB — PROTIME-INR
INR: 1.43 (ref 0.00–1.49)
Prothrombin Time: 17.1 seconds — ABNORMAL HIGH (ref 11.6–15.2)

## 2013-07-11 LAB — CBC
HEMATOCRIT: 31.6 % — AB (ref 39.0–52.0)
HEMOGLOBIN: 10.2 g/dL — AB (ref 13.0–17.0)
MCH: 26.6 pg (ref 26.0–34.0)
MCHC: 32.3 g/dL (ref 30.0–36.0)
MCV: 82.3 fL (ref 78.0–100.0)
Platelets: 180 10*3/uL (ref 150–400)
RBC: 3.84 MIL/uL — ABNORMAL LOW (ref 4.22–5.81)
RDW: 14.1 % (ref 11.5–15.5)
WBC: 13.6 10*3/uL — ABNORMAL HIGH (ref 4.0–10.5)

## 2013-07-11 MED ORDER — WARFARIN SODIUM 6 MG PO TABS
6.0000 mg | ORAL_TABLET | Freq: Once | ORAL | Status: AC
  Administered 2013-07-11: 6 mg via ORAL
  Filled 2013-07-11: qty 1

## 2013-07-11 MED ORDER — ENOXAPARIN SODIUM 30 MG/0.3ML ~~LOC~~ SOLN
30.0000 mg | Freq: Two times a day (BID) | SUBCUTANEOUS | Status: DC
  Administered 2013-07-11 – 2013-07-12 (×3): 30 mg via SUBCUTANEOUS
  Filled 2013-07-11 (×5): qty 0.3

## 2013-07-11 MED ORDER — WARFARIN SODIUM 4 MG PO TABS
4.0000 mg | ORAL_TABLET | Freq: Once | ORAL | Status: DC
  Filled 2013-07-11: qty 1

## 2013-07-11 NOTE — Progress Notes (Signed)
Physical Therapy Treatment Patient Details Name: BORA BRONER MRN: [id number] DOB: 09/25/1945 Today's Date: 07/11/2013 Time: 1540-0867 PT Time Calculation (min): 38 min  PT Assessment / Plan / Recommendation  History of Present Illness 68 yo male s/p bil TKR   PT Comments   POD # 3 pm session.  Applied B KI's then assisted pt OOB to amb twice with one sitting rest break.  Pt still requires + 2 assit for safety due to c/o feeling "dizzy" and x 1 posterior LOB when advancing RW.  Pt also still requires + 2 assist with sit to stand and stand to sit as pt still requires the use of B KI's for mobility.  Pt c/o 8/10 knee pain , not yet time for meds so applied ICE.  Follow Up Recommendations  CIR     Does the patient have the potential to tolerate intense rehabilitation     Barriers to Discharge        Equipment Recommendations       Recommendations for Other Services    Frequency 7X/week   Progress towards PT Goals Progress towards PT goals: Progressing toward goals  Plan      Precautions / Restrictions Precautions Precautions: Fall;Knee Precaution Comments: instructed pt on B KI use for amb and proper application  Restrictions Weight Bearing Restrictions: No RLE Weight Bearing: Weight bearing as tolerated LLE Weight Bearing: Weight bearing as tolerated   Pertinent Vitals/Pain C/o 8/10 B knee pain    Mobility  Bed Mobility Overal bed mobility: Needs Assistance Bed Mobility: Supine to Sit Supine to sit: Mod assist;Min assist;+2 for safety/equipment General bed mobility comments: cues for technique and assist for Bil LE Transfers Overall transfer level: Needs assistance Equipment used: Rolling walker (2 wheeled) Transfers: Sit to/from Stand Sit to Stand: Mod assist;+2 safety/equipment;+2 physical assistance General transfer comment: cues for LE management and use of UEs to self assist. Assist to rise, stabilize, control descent.  Ambulation/Gait Ambulation/Gait  assistance: +2 safety/equipment;Min assist;Mod assist Ambulation Distance (Feet): 60 Feet (30 x 2 one sitting rest break) Assistive device: Rolling walker (2 wheeled) Gait Pattern/deviations: Step-to pattern;Wide base of support;Decreased step length - right;Decreased step length - left Gait velocity: decr General Gait Details: cues for posture, sequence and position from RW    PT Goals (current goals can now be found in the care plan section)    Visit Information  Last PT Received On: 07/11/13 Assistance Needed: +2 History of Present Illness: 68 yo male s/p bil TKR    Subjective Data      Cognition       Balance     End of Session PT - End of Session Equipment Utilized During Treatment: Gait belt;Left knee immobilizer;Right knee immobilizer Activity Tolerance: Patient tolerated treatment well Patient left: in chair;with call bell/phone within reach   Rica Koyanagi  PTA [facility]  Acute  Rehab Pager      [phone]

## 2013-07-11 NOTE — Consult Note (Signed)
Physical Medicine and Rehabilitation Consult Reason for Consult: Bilateral Knee OA s/p B-TKR. Referring Physician: Dr. Maureen Ralphs.    HPI: Jorge Gray is a 68 y.o. male with history of HTN, DDD, bilateral knee OA with failure of conservative therapy. She elected to undergo B-TKR on 07/08/13 by Dr. Maureen Ralphs. Post op WBAT and on coumadin for DVT prophylaxis. Follow up labs with leucocytosis as well as ABLA. Epidural cath removes last pm. Therapies initiated and CIR recommended by rehab team. Ortho recommends coumadin  X 4 weeks followed by 81 mg ASA x 4 weeks.   Epidural catheter removed this am.  Pt feels legs are waking up, pain controlled at rest but not with activity  History of back surgery with chronic mild right foot drop ROS  Past Medical History  Diagnosis Date  . Hypercholesteremia   . Hypertension   . DDD (degenerative disc disease)   . Eczema   . Asthma     as child  . Rash     abd  . History of nonmelanoma skin cancer     basel cell removed from scalp   Past Surgical History  Procedure Laterality Date  . Back surgery      x 2  . Nose surgery    . Vasectomy    . Hip arthroscopy      bilateral   History reviewed. No pertinent family history.  Social History:  reports that he quit smoking about 15 years ago. He does not have any smokeless tobacco history on file. He reports that he drinks alcohol. He reports that he does not use illicit drugs.  Allergies: No Known Allergies  Medications Prior to Admission  Medication Sig Dispense Refill  . lisinopril-hydrochlorothiazide (PRINZIDE,ZESTORETIC) 10-12.5 MG per tablet Take 1 tablet by mouth every morning.      . tadalafil (CIALIS) 20 MG tablet Take 20 mg by mouth daily as needed for erectile dysfunction.      . traMADol (ULTRAM) 50 MG tablet Take 50-100 mg by mouth every 6 (six) hours as needed (pain).      . meloxicam (MOBIC) 15 MG tablet Take 15 mg by mouth daily.        Home: Home  Living Family/patient expects to be discharged to:: Inpatient rehab Living Arrangements: Spouse/significant other  Functional History:   Functional Status:  Mobility:     Ambulation/Gait Ambulation Distance (Feet): 59 Feet Gait velocity: decr General Gait Details: cues for posture, sequence and position from RW    ADL: ADL Toilet Transfer: Simulated;+2 Total assistance;Moderate assistance Toilet Transfer Method: Sit to stand Equipment Used: Rolling walker Transfers/Ambulation Related to ADLs: Simulated toilet transfer.  Cues for technique for sit to stand and for hand placement ADL Comments: Educated on AE but pt did not use.  Pt can perform UB adls with set up and LB ADLs require assist x 2 for safety.  Bathing, pt 50%; dressing, pt 20% without AE.  Pt can use AE for bathing at this time and will benefit from AE for dressing as LE strength improves  Cognition: Cognition Overall Cognitive Status: Within Functional Limits for tasks assessed Orientation Level: Oriented X4 Cognition Arousal/Alertness: Awake/alert Behavior During Therapy: WFL for tasks assessed/performed Overall Cognitive Status: Within Functional Limits for tasks assessed  Blood pressure 157/75, pulse 101, temperature 99.3 F (37.4 C), temperature source Oral, resp. rate 18, height 6' (1.829 m), weight 89.812 kg (198 lb), SpO2 93.00%. Physical Exam  Nursing note and vitals reviewed. Constitutional:  He is oriented to person, place, and time. He appears well-developed and well-nourished.  HENT:  Head: Normocephalic and atraumatic.  Right Ear: External ear normal.  Left Ear: External ear normal.  Mouth/Throat: Oropharynx is clear and moist.  Eyes: Conjunctivae and EOM are normal. Pupils are equal, round, and reactive to light.  Cardiovascular: Normal rate, regular rhythm and normal heart sounds.   Respiratory: Effort normal and breath sounds normal.  GI: Soft. Bowel sounds are normal.  Neurological: He is  alert and oriented to person, place, and time. No sensory deficit.  Motor strength is 5/5 in bilateral deltoid, bicep, tricep, grip 1/5 bilateral hip flexors and knee extensors 4/5 left ankle dorsiflexor plantar flexor 3+/5 right ankle dorsiflexor plantar flexor   Skin: Skin is warm and dry. There is erythema.  Edema around bilateral knee incision  Psychiatric: He has a normal mood and affect.    Results for orders placed during the hospital encounter of 07/08/13 (from the past 24 hour(s))  PROTIME-INR     Status: Abnormal   Collection Time    07/10/13  6:03 PM      Result Value Ref Range   Prothrombin Time 18.3 (*) 11.6 - 15.2 seconds   INR 1.57 (*) 0.00 - 1.49  CBC     Status: Abnormal   Collection Time    07/11/13  4:16 AM      Result Value Ref Range   WBC 13.6 (*) 4.0 - 10.5 K/uL   RBC 3.84 (*) 4.22 - 5.81 MIL/uL   Hemoglobin 10.2 (*) 13.0 - 17.0 g/dL   HCT 31.6 (*) 39.0 - 52.0 %   MCV 82.3  78.0 - 100.0 fL   MCH 26.6  26.0 - 34.0 pg   MCHC 32.3  30.0 - 36.0 g/dL   RDW 14.1  11.5 - 15.5 %   Platelets 180  150 - 400 K/uL  BASIC METABOLIC PANEL     Status: Abnormal   Collection Time    07/11/13  4:16 AM      Result Value Ref Range   Sodium 138  137 - 147 mEq/L   Potassium 3.8  3.7 - 5.3 mEq/L   Chloride 101  96 - 112 mEq/L   CO2 27  19 - 32 mEq/L   Glucose, Bld 97  70 - 99 mg/dL   BUN 12  6 - 23 mg/dL   Creatinine, Ser 0.78  0.50 - 1.35 mg/dL   Calcium 8.1 (*) 8.4 - 10.5 mg/dL   GFR calc non Af Amer >90  >90 mL/min   GFR calc Af Amer >90  >90 mL/min  PROTIME-INR     Status: Abnormal   Collection Time    07/11/13  4:16 AM      Result Value Ref Range   Prothrombin Time 17.1 (*) 11.6 - 15.2 seconds   INR 1.43  0.00 - 1.49   No results found.  Assessment/Plan: Diagnosis:  Total knee replacement postoperative day #3 Bilateral end-stage osteoarthritis of the knee  1. Does the need for close, 24 hr/day medical supervision in concert with the patient's rehab needs  make it unreasonable for this patient to be served in a less intensive setting? Potentially 2. Co-Morbidities requiring supervision/potential complications: chronic right foot drop, hypertension  3. Due to bowel management, safety, skin/wound care, disease management, medication administration and pain management, does the patient require 24 hr/day rehab nursing? Yes 4. Does the patient require coordinated care of a physician, rehab nurse, PT (1-2  hrs/day, 5 days/week) and OT (1-2 hrs/day, 5 days/week) to address physical and functional deficits in the context of the above medical diagnosis(es)? Potentially Addressing deficits in the following areas: balance, endurance, locomotion, strength, transferring, bowel/bladder control, bathing, dressing, feeding and toileting 5. Can the patient actively participate in an intensive therapy program of at least 3 hrs of therapy per day at least 5 days per week? Yes 6. The potential for patient to make measurable gains while on inpatient rehab is good 7. Anticipated functional outcomes upon discharge from inpatient rehab are modified independent  with PT,  modified independent  with OT,  not applicable  with SLP. 8. Estimated rehab length of stay to reach the above functional goals is:  5-7 days  9. Does the patient have adequate social supports to accommodate these discharge functional goals? Potentially 10. Anticipated D/C setting: Home 11. Anticipated post D/C treatments: [place] therapy 12. Overall Rehab/Functional Prognosis: good  RECOMMENDATIONS: This patient's condition is appropriate for continued rehabilitative care in the following setting: If patient still requiring physical assistance today than CIR if progress to supervision than home with home health once finished with acute care  Patient has agreed to participate in recommended program. Yes and Potentially Note that insurance prior authorization may be required for reimbursent for recommended  care.  Comment: rehab RN to f/u on PT progress    07/11/2013

## 2013-07-11 NOTE — Progress Notes (Signed)
Physical Therapy Treatment Patient Details Name: Jorge Gray MRN: [id number] DOB: 08-31-1945 Today's Date: 07/11/2013 Time: 7169-6789 PT Time Calculation (min): 40 min  PT Assessment / Plan / Recommendation  History of Present Illness 68 yo male s/p bil TKR   PT Comments   POD # 3 applied B KI and assisted OOB to amb in hallway then performed B TKR TE's followed by ICE.  Pt progressing well and is appropriated for CIR.    Follow Up Recommendations  CIR     Does the patient have the potential to tolerate intense rehabilitation     Barriers to Discharge        Equipment Recommendations       Recommendations for Other Services    Frequency 7X/week   Progress towards PT Goals Progress towards PT goals: Progressing toward goals  Plan      Precautions / Restrictions Precautions Precautions: Fall;Knee Precaution Comments: instructed pt on B KI use for amb and proper application  Restrictions Weight Bearing Restrictions: No RLE Weight Bearing: Weight bearing as tolerated LLE Weight Bearing: Weight bearing as tolerated   Pertinent Vitals/Pain C/o L>R pain   Mobility  Bed Mobility Overal bed mobility: Needs Assistance Bed Mobility: Supine to Sit Supine to sit: Mod assist;Min assist;+2 for safety/equipment General bed mobility comments: cues for technique and assist for Bil LE Transfers Overall transfer level: Needs assistance Equipment used: Rolling walker (2 wheeled) Transfers: Sit to/from Stand Sit to Stand: Mod assist;+2 safety/equipment;+2 physical assistance General transfer comment: cues for LE management and use of UEs to self assist. Assist to rise, stabilize, control descent.  Ambulation/Gait Ambulation/Gait assistance: +2 safety/equipment;Min assist;Mod assist Ambulation Distance (Feet): 42 Feet Assistive device: Rolling walker (2 wheeled) Gait Pattern/deviations: Step-to pattern;Wide base of support;Decreased step length - right;Decreased step length -  left Gait velocity: decr General Gait Details: cues for posture, sequence and position from RW    Exercises   Total Knee Replacement TE's 10 reps B LE ankle pumps 10 reps towel squeezes 10 reps knee presses 10 reps heel slides  10 reps SAQ's 10 reps SLR's 10 reps ABD Followed by ICE    PT Goals (current goals can now be found in the care plan section)    Visit Information  Last PT Received On: 07/11/13 Assistance Needed: +2 History of Present Illness: 68 yo male s/p bil TKR    Subjective Data      Cognition       Balance     End of Session PT - End of Session Equipment Utilized During Treatment: Gait belt;Left knee immobilizer;Right knee immobilizer Activity Tolerance: Patient tolerated treatment well Patient left: in chair;with call bell/phone within reach   Rica Koyanagi  PTA [facility]  Acute  Rehab Pager      [phone]

## 2013-07-11 NOTE — Progress Notes (Signed)
ANTICOAGULATION CONSULT NOTE - Follow up  Pharmacy Consult for Warfarin Indication: VTE prophylaxis  No Known Allergies  Patient Measurements: Height: 6' (182.9 cm) Weight: 198 lb (89.812 kg) IBW/kg (Calculated) : 77.6  Vital Signs: Temp: 99.3 F (37.4 C) (03/08 2200) Temp src: Oral (03/08 2200) BP: 157/75 mmHg (03/08 2200) Pulse Rate: 101 (03/08 2200)  Labs:  Recent Labs  07/09/13 0418  07/10/13 0505 07/10/13 1803 07/11/13 0416  HGB 11.9*  --  11.2*  --  10.2*  HCT 36.5*  --  33.7*  --  31.6*  PLT 172  --  195  --  180  LABPROT  --   < > 19.5* 18.3* 17.1*  INR  --   < > 1.70* 1.57* 1.43  CREATININE 0.77  --  0.81  --  0.78  < > = values in this interval not displayed.  Estimated Creatinine Clearance: 99.7 ml/min (by C-G formula based on Cr of 0.78).  Warfarin doses as inpatient:  3/6: 4mg  3/7: Held 3/8: 2.5mg  ordered but not charted.   Assessment: 27 yoM s/p bilateral total knee arthroplasty 3/6.  Pharmacy was consulted to begin warfarin post-op for VTE prophylaxis.   Apparently warfarin was started on the evening of surgery with epidural catheter in place.  Warfarin dose on POD#1 was held.  INR on POD#2 was 1.7 - vitamin K 1mg  IV was administered.  Six hours later the INR was down to 1.57 and the epidural catheter was removed by anesthesiology at 7:25pm 3/8.  Reportedly tolerating regular diet.  H/H acceptable, pltc WNL.  No bleeding reported.  Orders noted to begin Lovenox 30 mg SQ q12h this AM (approximately 12 hours after epidural catheter removal).  INR subtherapeutic today after low-dose vitamin K yesterday.  Goal of Therapy:  INR 2-3 Monitor platelets by anticoagulation protocol: Yes   Plan:  1) Warfarin 6mg  tonight at 1800 2) Daily PT/INR while inpatient 3) Prophylactic-dose Lovenox until INR therapeutic on warfarin  Clayburn Pert, PharmD, BCPS Pager: [phone] 07/11/2013  8:39 AM

## 2013-07-11 NOTE — Progress Notes (Signed)
Subjective: 3 Days Post-Op Procedure(s) (LRB): TOTAL KNEE BILATERAL (Bilateral) Patient reports pain as mild.   Patient seen in rounds by Dr. Wynelle Link. Patient is well, but has had some minor complaints of pain in the knees, requiring pain medications Plan is to go Rehab after hospital stay.  Objective: Vital signs in last 24 hours: Temp:  [98.3 F (36.8 C)-99.3 F (37.4 C)] 99.3 F (37.4 C) (03/08 2200) Pulse Rate:  [87-101] 101 (03/08 2200) Resp:  [18-20] 18 (03/08 2200) BP: (157-190)/(75-85) 157/75 mmHg (03/08 2200) SpO2:  [93 %-99 %] 93 % (03/08 2200)  Intake/Output from previous day:  Intake/Output Summary (Last 24 hours) at 07/11/13 0741 Last data filed at 07/11/13 0500  Gross per 24 hour  Intake 832.34 ml  Output   4675 ml  Net -3842.66 ml     Labs:  Recent Labs  07/09/13 0418 07/10/13 0505 07/11/13 0416  HGB 11.9* 11.2* 10.2*    Recent Labs  07/09/13 0418 07/10/13 0505 07/11/13 0416  WBC 12.7* 17.8* 13.6*  RBC 4.44 4.11* 3.84*  HCT 36.5* 33.7* 31.6*  PLT 172 195 180    Recent Labs  07/09/13 0418 07/10/13 0505 07/11/13 0416  NA 137 138 138  K 4.0 4.4 3.8  CL 103 102 101  BUN 17 16 12   CREATININE 0.77 0.81 0.78  GLUCOSE 173* 134* 97  CALCIUM 8.4 8.7 8.1*    Recent Labs  07/09/13 1026 07/10/13 0505 07/10/13 1803 07/11/13 0416  INR 1.38 1.70* 1.57* 1.43    EXAM General - Patient is Alert and Appropriate Extremity - Neurovascular intact Sensation intact distally Dressing/Incision - clean, dry, no drainage, healing Motor Function - intact, moving feet and toes well on exam.    Past Medical History  Diagnosis Date  . Hypercholesteremia   . Hypertension   . DDD (degenerative disc disease)   . Eczema   . Asthma     as child  . Rash     abd  . History of nonmelanoma skin cancer     basel cell removed from scalp    Assessment/Plan: 3 Days Post-Op Procedure(s) (LRB): TOTAL KNEE BILATERAL (Bilateral) Principal Problem:  OA (osteoarthritis) of knee  Estimated body mass index is 26.85 kg/(m^2) as calculated from the following:   Height as of this encounter: 6' (1.829 m).   Weight as of this encounter: 89.812 kg (198 lb). Up with therapy Plan for Rehab.  Await final approval. Maybe tomorrow if approved and bed available.  DVT Prophylaxis - Lovenox and Coumadin.  Reviewed the notes from the weekend where the Coumadin was started the day of surgery. INR was elevated on day two at 1.7.  Patient had to be given Vit. K and INR came down to 1.57 and then epidural was removed.  INR is 1.43 this morning.  Weight-Bearing as tolerated to both legs  Take Coumadin for four weeks and then discontinue.  The dose may need to be adjusted based upon the INR.  Please follow the INR and titrate Coumadin dose for a therapeutic range between 2.0 and 3.0 INR.  After completing the four weeks of Coumadin, the patient may stop the Coumadin and then take and 81 mg Aspirin daily for four more weeks.  Lovenox injections will start later this evening after the epidural has been removed and continue until the INR is therapeutic at or greater than 2.0.  When INR reaches the therapeutic level of equal to or greater than 2.0, the patient may discontinue the  Lovenox injections.  PERKINS, ALEXZANDREW 07/11/2013, 7:41 AM

## 2013-07-12 ENCOUNTER — Inpatient Hospital Stay (HOSPITAL_COMMUNITY)
Admission: RE | Admit: 2013-07-12 | Discharge: 2013-07-18 | DRG: 945 | Disposition: A | Discharge status: Home / Self Care | Payer: Managed Care, Other (non HMO) | Source: Intra-hospital | Attending: Physical Medicine & Rehabilitation | Admitting: Physical Medicine & Rehabilitation

## 2013-07-12 DIAGNOSIS — E7800 Pure hypercholesterolemia, unspecified: Secondary | ICD-10-CM

## 2013-07-12 DIAGNOSIS — D62 Acute posthemorrhagic anemia: Secondary | ICD-10-CM | POA: Diagnosis present

## 2013-07-12 DIAGNOSIS — IMO0002 Reserved for concepts with insufficient information to code with codable children: Secondary | ICD-10-CM

## 2013-07-12 DIAGNOSIS — Z87891 Personal history of nicotine dependence: Secondary | ICD-10-CM

## 2013-07-12 DIAGNOSIS — Z5189 Encounter for other specified aftercare: Principal | ICD-10-CM

## 2013-07-12 DIAGNOSIS — Z85828 Personal history of other malignant neoplasm of skin: Secondary | ICD-10-CM

## 2013-07-12 DIAGNOSIS — E876 Hypokalemia: Secondary | ICD-10-CM

## 2013-07-12 DIAGNOSIS — D72829 Elevated white blood cell count, unspecified: Secondary | ICD-10-CM

## 2013-07-12 DIAGNOSIS — M179 Osteoarthritis of knee, unspecified: Secondary | ICD-10-CM | POA: Diagnosis present

## 2013-07-12 DIAGNOSIS — I10 Essential (primary) hypertension: Secondary | ICD-10-CM

## 2013-07-12 DIAGNOSIS — Z96659 Presence of unspecified artificial knee joint: Secondary | ICD-10-CM

## 2013-07-12 DIAGNOSIS — M1710 Unilateral primary osteoarthritis, unspecified knee: Secondary | ICD-10-CM

## 2013-07-12 DIAGNOSIS — Z79899 Other long term (current) drug therapy: Secondary | ICD-10-CM

## 2013-07-12 LAB — PROTIME-INR
INR: 1.25 (ref 0.00–1.49)
Prothrombin Time: 15.4 seconds — ABNORMAL HIGH (ref 11.6–15.2)

## 2013-07-12 LAB — CBC
HCT: 29.7 % — ABNORMAL LOW (ref 39.0–52.0)
Hemoglobin: 10 g/dL — ABNORMAL LOW (ref 13.0–17.0)
MCH: 27.2 pg (ref 26.0–34.0)
MCHC: 33.7 g/dL (ref 30.0–36.0)
MCV: 80.9 fL (ref 78.0–100.0)
Platelets: 206 10*3/uL (ref 150–400)
RBC: 3.67 MIL/uL — ABNORMAL LOW (ref 4.22–5.81)
RDW: 13.9 % (ref 11.5–15.5)
WBC: 10.8 10*3/uL — AB (ref 4.0–10.5)

## 2013-07-12 MED ORDER — OXYCODONE HCL 5 MG PO TABS: 5.0000 mg | ORAL_TABLET | ORAL | Status: DC | PRN

## 2013-07-12 MED ORDER — ENOXAPARIN SODIUM 30 MG/0.3ML ~~LOC~~ SOLN
30.0000 mg | Freq: Two times a day (BID) | SUBCUTANEOUS | Status: DC
Start: 2013-07-12 — End: 2013-07-15
  Administered 2013-07-12 – 2013-07-14 (×5): 30 mg via SUBCUTANEOUS
  Filled 2013-07-12 (×8): qty 0.3

## 2013-07-12 MED ORDER — DSS 100 MG PO CAPS: 100.0000 mg | ORAL_CAPSULE | Freq: Two times a day (BID) | ORAL | Status: DC

## 2013-07-12 MED ORDER — WARFARIN SODIUM 7.5 MG PO TABS
7.5000 mg | ORAL_TABLET | Freq: Once | ORAL | Status: DC
  Filled 2013-07-12: qty 1

## 2013-07-12 MED ORDER — MENTHOL 3 MG MT LOZG: 1.0000 | LOZENGE | OROMUCOSAL | Status: DC | PRN

## 2013-07-12 MED ORDER — TRAZODONE HCL 50 MG PO TABS: 25.0000 mg | ORAL_TABLET | Freq: Every evening | ORAL | Status: DC | PRN

## 2013-07-12 MED ORDER — WARFARIN - PHARMACIST DOSING INPATIENT: Freq: Every day | Status: DC

## 2013-07-12 MED ORDER — METHOCARBAMOL 500 MG PO TABS: 500.0000 mg | ORAL_TABLET | Freq: Four times a day (QID) | ORAL | Status: DC | PRN

## 2013-07-12 MED ORDER — DIPHENHYDRAMINE HCL 12.5 MG/5ML PO ELIX: 12.5000 mg | ORAL_SOLUTION | ORAL | Status: DC | PRN

## 2013-07-12 MED ORDER — BISACODYL 10 MG RE SUPP: 10.0000 mg | Freq: Every day | RECTAL | Status: DC | PRN

## 2013-07-12 MED ORDER — PHENOL 1.4 % MT LIQD: 1.0000 | OROMUCOSAL | Status: DC | PRN

## 2013-07-12 MED ORDER — GUAIFENESIN-DM 100-10 MG/5ML PO SYRP: 5.0000 mL | ORAL_SOLUTION | Freq: Four times a day (QID) | ORAL | Status: DC | PRN

## 2013-07-12 MED ORDER — WARFARIN SODIUM 7.5 MG PO TABS
7.5000 mg | ORAL_TABLET | Freq: Once | ORAL | Status: AC
  Administered 2013-07-12: 7.5 mg via ORAL
  Filled 2013-07-12: qty 1

## 2013-07-12 MED ORDER — OXYCODONE HCL 5 MG PO TABS
5.0000 mg | ORAL_TABLET | ORAL | Status: DC | PRN
  Administered 2013-07-12 – 2013-07-13 (×4): 10 mg via ORAL
  Administered 2013-07-13: 5 mg via ORAL
  Administered 2013-07-14 (×3): 10 mg via ORAL
  Administered 2013-07-15 (×2): 15 mg via ORAL
  Administered 2013-07-15: 10 mg via ORAL
  Administered 2013-07-15 – 2013-07-16 (×5): 15 mg via ORAL
  Administered 2013-07-17: 5 mg via ORAL
  Administered 2013-07-17 (×2): 15 mg via ORAL
  Administered 2013-07-18 (×3): 10 mg via ORAL
  Filled 2013-07-12: qty 2
  Filled 2013-07-12: qty 3
  Filled 2013-07-12 (×4): qty 2
  Filled 2013-07-12 (×2): qty 3
  Filled 2013-07-12: qty 1
  Filled 2013-07-12 (×2): qty 2
  Filled 2013-07-12: qty 3
  Filled 2013-07-12: qty 2
  Filled 2013-07-12: qty 3
  Filled 2013-07-12: qty 2
  Filled 2013-07-12 (×2): qty 3
  Filled 2013-07-12: qty 1
  Filled 2013-07-12: qty 2
  Filled 2013-07-12: qty 3
  Filled 2013-07-12: qty 2
  Filled 2013-07-12: qty 3
  Filled 2013-07-12: qty 2

## 2013-07-12 MED ORDER — TRAMADOL HCL 50 MG PO TABS
50.0000 mg | ORAL_TABLET | Freq: Four times a day (QID) | ORAL | Status: DC | PRN
  Administered 2013-07-13 (×2): 50 mg via ORAL
  Administered 2013-07-14 – 2013-07-16 (×5): 100 mg via ORAL
  Administered 2013-07-17: 50 mg via ORAL
  Administered 2013-07-17 – 2013-07-18 (×2): 100 mg via ORAL
  Filled 2013-07-12 (×2): qty 2
  Filled 2013-07-12: qty 1
  Filled 2013-07-12 (×2): qty 2
  Filled 2013-07-12 (×2): qty 1
  Filled 2013-07-12 (×4): qty 2

## 2013-07-12 MED ORDER — ALUM & MAG HYDROXIDE-SIMETH 200-200-20 MG/5ML PO SUSP: 30.0000 mL | ORAL | Status: DC | PRN

## 2013-07-12 MED ORDER — POLYETHYLENE GLYCOL 3350 17 G PO PACK: 17.0000 g | PACK | Freq: Every day | ORAL | Status: DC | PRN

## 2013-07-12 MED ORDER — OXYCODONE HCL 5 MG PO TABS: 15.0000 mg | ORAL_TABLET | ORAL | Status: DC | PRN

## 2013-07-12 MED ORDER — ACETAMINOPHEN 325 MG PO TABS: 650.0000 mg | ORAL_TABLET | Freq: Four times a day (QID) | ORAL | Status: DC | PRN

## 2013-07-12 MED ORDER — METHOCARBAMOL 500 MG PO TABS
500.0000 mg | ORAL_TABLET | Freq: Four times a day (QID) | ORAL | Status: DC | PRN
  Administered 2013-07-17: 500 mg via ORAL
  Filled 2013-07-12: qty 1

## 2013-07-12 MED ORDER — FLEET ENEMA 7-19 GM/118ML RE ENEM: 1.0000 | ENEMA | Freq: Once | RECTAL | Status: AC | PRN

## 2013-07-12 MED ORDER — ONDANSETRON HCL 4 MG/2ML IJ SOLN: 4.0000 mg | Freq: Four times a day (QID) | INTRAMUSCULAR | Status: DC | PRN

## 2013-07-12 MED ORDER — WARFARIN SODIUM 7.5 MG PO TABS: 7.5000 mg | ORAL_TABLET | Freq: Once | ORAL | Status: DC

## 2013-07-12 MED ORDER — METOCLOPRAMIDE HCL 5 MG PO TABS: 5.0000 mg | ORAL_TABLET | Freq: Three times a day (TID) | ORAL | Status: DC | PRN

## 2013-07-12 MED ORDER — ONDANSETRON HCL 4 MG PO TABS: 4.0000 mg | ORAL_TABLET | Freq: Four times a day (QID) | ORAL | Status: DC | PRN

## 2013-07-12 MED ORDER — POLYSACCHARIDE IRON COMPLEX 150 MG PO CAPS
150.0000 mg | ORAL_CAPSULE | Freq: Every day | ORAL | Status: DC
  Administered 2013-07-12 – 2013-07-18 (×7): 150 mg via ORAL
  Filled 2013-07-12 (×8): qty 1

## 2013-07-12 MED ORDER — ENOXAPARIN SODIUM 30 MG/0.3ML ~~LOC~~ SOLN: 30.0000 mg | Freq: Two times a day (BID) | SUBCUTANEOUS | Status: DC

## 2013-07-12 MED ORDER — POLYETHYLENE GLYCOL 3350 17 G PO PACK
17.0000 g | PACK | Freq: Every day | ORAL | Status: DC
  Administered 2013-07-13 – 2013-07-17 (×4): 17 g via ORAL
  Filled 2013-07-12 (×7): qty 1

## 2013-07-12 NOTE — Progress Notes (Signed)
Inpt. Rehab  I have met with patient and his wife at the bedside concerning his post acute rehab options.  They request that pt. be admitted to [facility] and I have insurance authorization and bed availability.   I will plan to admit today.  Please call with questions.     [facility] Admissions Coordinator Cell [phone] Office [phone]

## 2013-07-12 NOTE — Discharge Summary (Signed)
Physician Discharge Summary   Patient ID: Jorge Gray MRN: [id number] DOB/AGE: Apr 16, 1946 68 y.o.  Admit date: 07/08/2013 Discharge date: 07-12-2013  Primary Diagnosis:  Osteoarthritis Bilateral knee(s)  Admission Diagnoses:  Past Medical History  Diagnosis Date  . Hypercholesteremia   . Hypertension   . DDD (degenerative disc disease)   . Eczema   . Asthma     as child  . Rash     abd  . History of nonmelanoma skin cancer     basel cell removed from scalp   Discharge Diagnoses:   Principal Problem:   OA (osteoarthritis) of knee Active Problems:   Postoperative anemia due to acute blood loss   Unspecified essential hypertension   Pure hypercholesterolemia  Estimated body mass index is 26.85 kg/(m^2) as calculated from the following:   Height as of this encounter: 6' (1.829 m).   Weight as of this encounter: 89.812 kg (198 lb).  Procedure:  Procedure(s) (LRB): TOTAL KNEE BILATERAL (Bilateral)   Consults: CIR and Anesthesia  HPI: Jorge Gray is a 68 y.o. year old male with end stage OA of his right knee with progressively worsening pain and dysfunction. He has constant pain, with activity and at rest and significant functional deficits with difficulties even with ADLs. He has had extensive non-op management including analgesics, injections of cortisone and viscosupplements, and home exercise program, but remains in significant pain with significant dysfunction. Radiographs show bone on bone arthritis medial and patellofemoral on left and lateral and patellofemoral on right. He was given the option of doing both knees in the same setting versus one at a time. We discussed procedure, risks, potential complications, pros, cons and rehab course associated with each option and he elected to do both at the same setting. , He presents now for bilateral Total Knee Arthroplasty.   Laboratory Data: Admission on 07/08/2013  Component Date Value Ref Range Status  .  ABO/RH(D) 07/08/2013 A POS   Final  . Antibody Screen 07/08/2013 NEG   Final  . Sample Expiration 07/08/2013 07/11/2013   Final  . ABO/RH(D) 07/08/2013 A POS   Final  . WBC 07/09/2013 12.7* 4.0 - 10.5 K/uL Final  . RBC 07/09/2013 4.44  4.22 - 5.81 MIL/uL Final  . Hemoglobin 07/09/2013 11.9* 13.0 - 17.0 g/dL Final  . HCT 07/09/2013 36.5* 39.0 - 52.0 % Final  . MCV 07/09/2013 82.2  78.0 - 100.0 fL Final  . MCH 07/09/2013 26.8  26.0 - 34.0 pg Final  . MCHC 07/09/2013 32.6  30.0 - 36.0 g/dL Final  . RDW 07/09/2013 13.5  11.5 - 15.5 % Final  . Platelets 07/09/2013 172  150 - 400 K/uL Final  . Sodium 07/09/2013 137  137 - 147 mEq/L Final  . Potassium 07/09/2013 4.0  3.7 - 5.3 mEq/L Final  . Chloride 07/09/2013 103  96 - 112 mEq/L Final  . CO2 07/09/2013 24  19 - 32 mEq/L Final  . Glucose, Bld 07/09/2013 173* 70 - 99 mg/dL Final  . BUN 07/09/2013 17  6 - 23 mg/dL Final  . Creatinine, Ser 07/09/2013 0.77  0.50 - 1.35 mg/dL Final  . Calcium 07/09/2013 8.4  8.4 - 10.5 mg/dL Final  . GFR calc non Af Amer 07/09/2013 >90  >90 mL/min Final  . GFR calc Af Amer 07/09/2013 >90  >90 mL/min Final   Comment: (NOTE)  The eGFR has been calculated using the CKD EPI equation.                          This calculation has not been validated in all clinical situations.                          eGFR's persistently <90 mL/min signify possible Chronic Kidney                          Disease.  Marland Kitchen Prothrombin Time 07/09/2013 16.6* 11.6 - 15.2 seconds Final  . INR 07/09/2013 1.38  0.00 - 1.49 Final  . WBC 07/10/2013 17.8* 4.0 - 10.5 K/uL Final  . RBC 07/10/2013 4.11* 4.22 - 5.81 MIL/uL Final  . Hemoglobin 07/10/2013 11.2* 13.0 - 17.0 g/dL Final  . HCT 07/10/2013 33.7* 39.0 - 52.0 % Final  . MCV 07/10/2013 82.0  78.0 - 100.0 fL Final  . MCH 07/10/2013 27.3  26.0 - 34.0 pg Final  . MCHC 07/10/2013 33.2  30.0 - 36.0 g/dL Final  . RDW 07/10/2013 14.0  11.5 - 15.5 % Final  . Platelets  07/10/2013 195  150 - 400 K/uL Final  . Sodium 07/10/2013 138  137 - 147 mEq/L Final  . Potassium 07/10/2013 4.4  3.7 - 5.3 mEq/L Final  . Chloride 07/10/2013 102  96 - 112 mEq/L Final  . CO2 07/10/2013 26  19 - 32 mEq/L Final  . Glucose, Bld 07/10/2013 134* 70 - 99 mg/dL Final  . BUN 07/10/2013 16  6 - 23 mg/dL Final  . Creatinine, Ser 07/10/2013 0.81  0.50 - 1.35 mg/dL Final  . Calcium 07/10/2013 8.7  8.4 - 10.5 mg/dL Final  . GFR calc non Af Amer 07/10/2013 >90  >90 mL/min Final  . GFR calc Af Amer 07/10/2013 >90  >90 mL/min Final   Comment: (NOTE)                          The eGFR has been calculated using the CKD EPI equation.                          This calculation has not been validated in all clinical situations.                          eGFR's persistently <90 mL/min signify possible Chronic Kidney                          Disease.  Marland Kitchen Prothrombin Time 07/10/2013 19.5* 11.6 - 15.2 seconds Final  . INR 07/10/2013 1.70* 0.00 - 1.49 Final  . Prothrombin Time 07/10/2013 18.3* 11.6 - 15.2 seconds Final  . INR 07/10/2013 1.57* 0.00 - 1.49 Final  . WBC 07/11/2013 13.6* 4.0 - 10.5 K/uL Final  . RBC 07/11/2013 3.84* 4.22 - 5.81 MIL/uL Final  . Hemoglobin 07/11/2013 10.2* 13.0 - 17.0 g/dL Final  . HCT 07/11/2013 31.6* 39.0 - 52.0 % Final  . MCV 07/11/2013 82.3  78.0 - 100.0 fL Final  . MCH 07/11/2013 26.6  26.0 - 34.0 pg Final  . MCHC 07/11/2013 32.3  30.0 - 36.0 g/dL Final  . RDW 07/11/2013 14.1  11.5 - 15.5 % Final  . Platelets 07/11/2013 180  150 -  400 K/uL Final  . Sodium 07/11/2013 138  137 - 147 mEq/L Final  . Potassium 07/11/2013 3.8  3.7 - 5.3 mEq/L Final  . Chloride 07/11/2013 101  96 - 112 mEq/L Final  . CO2 07/11/2013 27  19 - 32 mEq/L Final  . Glucose, Bld 07/11/2013 97  70 - 99 mg/dL Final  . BUN 07/11/2013 12  6 - 23 mg/dL Final  . Creatinine, Ser 07/11/2013 0.78  0.50 - 1.35 mg/dL Final  . Calcium 07/11/2013 8.1* 8.4 - 10.5 mg/dL Final  . GFR calc non Af Amer  07/11/2013 >90  >90 mL/min Final  . GFR calc Af Amer 07/11/2013 >90  >90 mL/min Final   Comment: (NOTE)                          The eGFR has been calculated using the CKD EPI equation.                          This calculation has not been validated in all clinical situations.                          eGFR's persistently <90 mL/min signify possible Chronic Kidney                          Disease.  Marland Kitchen Prothrombin Time 07/11/2013 17.1* 11.6 - 15.2 seconds Final  . INR 07/11/2013 1.43  0.00 - 1.49 Final  . WBC 07/12/2013 10.8* 4.0 - 10.5 K/uL Final  . RBC 07/12/2013 3.67* 4.22 - 5.81 MIL/uL Final  . Hemoglobin 07/12/2013 10.0* 13.0 - 17.0 g/dL Final  . HCT 07/12/2013 29.7* 39.0 - 52.0 % Final  . MCV 07/12/2013 80.9  78.0 - 100.0 fL Final  . MCH 07/12/2013 27.2  26.0 - 34.0 pg Final  . MCHC 07/12/2013 33.7  30.0 - 36.0 g/dL Final  . RDW 07/12/2013 13.9  11.5 - 15.5 % Final  . Platelets 07/12/2013 206  150 - 400 K/uL Final  . Prothrombin Time 07/12/2013 15.4* 11.6 - 15.2 seconds Final  . INR 07/12/2013 1.25  0.00 - 1.49 Final  Hospital Outpatient Visit on 07/05/2013  Component Date Value Ref Range Status  . aPTT 07/05/2013 28  24 - 37 seconds Final  . WBC 07/05/2013 8.7  4.0 - 10.5 K/uL Final  . RBC 07/05/2013 5.97* 4.22 - 5.81 MIL/uL Final  . Hemoglobin 07/05/2013 16.3  13.0 - 17.0 g/dL Final  . HCT 07/05/2013 49.1  39.0 - 52.0 % Final  . MCV 07/05/2013 82.2  78.0 - 100.0 fL Final  . MCH 07/05/2013 27.3  26.0 - 34.0 pg Final  . MCHC 07/05/2013 33.2  30.0 - 36.0 g/dL Final  . RDW 07/05/2013 13.6  11.5 - 15.5 % Final  . Platelets 07/05/2013 190  150 - 400 K/uL Final  . Sodium 07/05/2013 138  137 - 147 mEq/L Final  . Potassium 07/05/2013 4.3  3.7 - 5.3 mEq/L Final  . Chloride 07/05/2013 97  96 - 112 mEq/L Final  . CO2 07/05/2013 27  19 - 32 mEq/L Final  . Glucose, Bld 07/05/2013 76  70 - 99 mg/dL Final  . BUN 07/05/2013 18  6 - 23 mg/dL Final  . Creatinine, Ser 07/05/2013 0.79  0.50 -  1.35 mg/dL Final  . Calcium 07/05/2013 9.8  8.4 - 10.5 mg/dL  Final  . Total Protein 07/05/2013 7.2  6.0 - 8.3 g/dL Final  . Albumin 07/05/2013 4.0  3.5 - 5.2 g/dL Final  . AST 07/05/2013 26  0 - 37 U/L Final  . ALT 07/05/2013 23  0 - 53 U/L Final  . Alkaline Phosphatase 07/05/2013 61  39 - 117 U/L Final  . Total Bilirubin 07/05/2013 0.3  0.3 - 1.2 mg/dL Final  . GFR calc non Af Amer 07/05/2013 >90  >90 mL/min Final  . GFR calc Af Amer 07/05/2013 >90  >90 mL/min Final   Comment: (NOTE)                          The eGFR has been calculated using the CKD EPI equation.                          This calculation has not been validated in all clinical situations.                          eGFR's persistently <90 mL/min signify possible Chronic Kidney                          Disease.  Marland Kitchen Prothrombin Time 07/05/2013 13.1  11.6 - 15.2 seconds Final  . INR 07/05/2013 1.01  0.00 - 1.49 Final  . Color, Urine 07/05/2013 YELLOW  YELLOW Final  . APPearance 07/05/2013 CLEAR  CLEAR Final  . Specific Gravity, Urine 07/05/2013 1.010  1.005 - 1.030 Final  . pH 07/05/2013 7.0  5.0 - 8.0 Final  . Glucose, UA 07/05/2013 NEGATIVE  NEGATIVE mg/dL Final  . Hgb urine dipstick 07/05/2013 NEGATIVE  NEGATIVE Final  . Bilirubin Urine 07/05/2013 NEGATIVE  NEGATIVE Final  . Ketones, ur 07/05/2013 NEGATIVE  NEGATIVE mg/dL Final  . Protein, ur 07/05/2013 NEGATIVE  NEGATIVE mg/dL Final  . Urobilinogen, UA 07/05/2013 0.2  0.0 - 1.0 mg/dL Final  . Nitrite 07/05/2013 NEGATIVE  NEGATIVE Final  . Leukocytes, UA 07/05/2013 NEGATIVE  NEGATIVE Final   MICROSCOPIC NOT DONE ON URINES WITH NEGATIVE PROTEIN, BLOOD, LEUKOCYTES, NITRITE, OR GLUCOSE <1000 mg/dL.  Marland Kitchen MRSA, PCR 07/05/2013 NEGATIVE  NEGATIVE Final  . Staphylococcus aureus 07/05/2013 POSITIVE* NEGATIVE Final   Comment:                                 The Xpert SA Assay (FDA                          approved for NASAL specimens                          in patients over 51  years of age),                          is one component of                          a comprehensive surveillance                          program.  Test performance has  been validated by [facility] for patients greater                          than or equal to 60 year old.                          It is not intended                          to diagnose infection nor to                          guide or monitor treatment.     X-Rays:Dg Chest 2 View  07/05/2013   CLINICAL DATA:  Preoperative radiograph.  Knee surgery.  EXAM: CHEST  2 VIEW  COMPARISON:  None.  FINDINGS: Cardiopericardial silhouette within normal limits. Mediastinal contours normal. Trachea midline. No airspace disease or effusion. Mild right basilar atelectasis. Left basilar atelectasis is also present adjacent to the cardiac apex.  IMPRESSION: No active cardiopulmonary disease.   Electronically Signed   By: Dereck Ligas M.D.   On: 07/05/2013 15:18    EKG: Orders placed during the hospital encounter of 07/05/13  . EKG 12-LEAD  . EKG 12-LEAD     Hospital Course: Patient was admitted to [facility] and taken to the OR and underwent the above stated procedure well without complications.  Patient tolerated the procedure well and was later transferred to the recovery room and then to the orthopaedic floor for postoperative care. CIR was consulted to see if patient would be appropriate for admission.  Anesthesia was consulted postoperatively to place an epidural in for postoperative pain management. The patient was also given PO and IV analgesics for pain control following their surgery.  They were given 24 hours of postoperative antibiotics and started on DVT prophylaxis in the form of Lovenox and Coumadin after the epidural had been removed.   PT and OT were ordered for total joint protocol.  Discharge planning consulted to help with postop disposition and equipment needs.   Patient had a good night on the evening of surgery and started to get up OOB with therapy on day one.  Hemovac drains were pulled without difficulty on day one.  Continued to work with therapy into day two.  Dressings were changed on day two and both incisions were healing well.  Unfortunately, the INR was elevated to 1.7 on day two.  The patient's coumadin was started the night of surgery instead the following day after surgery so the INR bumped quickly.  He was given vitamin K and INR came down to 1.57 and the epidural was removed without difficulty by Anesthesia.  By day three, the patient started to show progress with therapy.  They continued to receive therapy each day for continued total knee protocol.  The incisions were healing well.  They continued to progress on day four was ready to go to go to CIR.    Discharge Medications: Prior to Admission medications   Medication Sig Start Date End Date Taking? Authorizing Provider  lisinopril-hydrochlorothiazide (PRINZIDE,ZESTORETIC) 10-12.5 MG per tablet Take 1 tablet by mouth every morning.   Yes Historical Provider, MD  traMADol (ULTRAM) 50 MG tablet Take  50-100 mg by mouth every 6 (six) hours as needed (pain).   Yes Historical Provider, MD  acetaminophen (TYLENOL) 325 MG tablet Take 2 tablets (650 mg total) by mouth every 6 (six) hours as needed for mild pain (or Fever >/= 101). 07/12/13   Alexzandrew Perkins, PA-C  bisacodyl (DULCOLAX) 10 MG suppository Place 1 suppository (10 mg total) rectally daily as needed for moderate constipation. 07/12/13   Alexzandrew Perkins, PA-C  docusate sodium 100 MG CAPS Take 100 mg by mouth 2 (two) times daily. 07/12/13   Alexzandrew Perkins, PA-C  enoxaparin (LOVENOX) 30 MG/0.3ML injection Inject 0.3 mLs (30 mg total) into the skin every 12 (twelve) hours. Continue Lovenox injections until the INR is therapeutic at or greater than 2.0.  When INR reaches the therapeutic level of equal to or greater than 2.0, the  patient may discontinue the Lovenox injections. 07/12/13   Alexzandrew Perkins, PA-C  methocarbamol (ROBAXIN) 500 MG tablet Take 1 tablet (500 mg total) by mouth every 6 (six) hours as needed for muscle spasms. 07/12/13   Alexzandrew Dara Lords, PA-C  metoCLOPramide (REGLAN) 5 MG tablet Take 1-2 tablets (5-10 mg total) by mouth every 8 (eight) hours as needed for nausea (if ondansetron (ZOFRAN) ineffective.). 07/12/13   Alexzandrew Perkins, PA-C  ondansetron (ZOFRAN) 4 MG tablet Take 1 tablet (4 mg total) by mouth every 6 (six) hours as needed for nausea. 07/12/13   Alexzandrew Perkins, PA-C  oxyCODONE (OXY IR/ROXICODONE) 5 MG immediate release tablet Take 1-4 tablets (5-20 mg total) by mouth every 3 (three) hours as needed for moderate pain, severe pain or breakthrough pain. 07/12/13   Alexzandrew Perkins, PA-C  polyethylene glycol (MIRALAX / GLYCOLAX) packet Take 17 g by mouth daily as needed for mild constipation. 07/12/13   Alexzandrew Dara Lords, PA-C  warfarin (COUMADIN) 7.5 MG tablet Take 1 tablet (7.5 mg total) by mouth one time only at 6 PM. Take Coumadin for three weeks and then discontinue.  The dose may need to be adjusted based upon the INR.  Please follow the INR and titrate Coumadin dose for a therapeutic range between 2.0 and 3.0 INR.  After completing the three weeks of Coumadin, the patient may stop the Coumadin and then take an  81 mg Aspirin daily for four more weeks. 07/12/13   Alexzandrew Perkins, PA-C    Take Coumadin for 4 weeks and then discontinue.  The dose may need to be adjusted based upon the INR.  Please follow the INR and titrate Coumadin dose for a therapeutic range between 2.0 and 3.0 INR.  After completing the 4 weeks of Coumadin, the patient may stop the Coumadin and then take an 81 mg Aspirin daily for four more weeks.  Continue Lovenox injections until the INR is therapeutic at or greater than 2.0.  When INR reaches the therapeutic level of equal to or greater than 2.0, the  patient may discontinue the Lovenox injections.  Discharge to CIR  Diet - Cardiac diet  Follow up - in 2 weeks  Activity - WBAT  Disposition - Rehab  Condition Upon Discharge - Good  D/C Meds - See DC Summary  DVT Prophylaxis - Lovenox and Coumadin       Discharge Orders   Future Orders Complete By Expires   Call MD / Call 911  As directed    Comments:     If you experience chest pain or shortness of breath, CALL 911 and be transported to the hospital emergency room.  If you develope  a fever above 101 F, pus (white drainage) or increased drainage or redness at the wound, or calf pain, call your surgeon's office.   Change dressing  As directed    Comments:     Change dressing daily with sterile 4 x 4 inch gauze dressing and apply TED hose. Do not submerge the incision under water.   Constipation Prevention  As directed    Comments:     Drink plenty of fluids.  Prune juice may be helpful.  You may use a stool softener, such as Colace (over the counter) 100 mg twice a day.  Use MiraLax (over the counter) for constipation as needed.   Diet - low sodium heart healthy  As directed    Discharge instructions  As directed    Comments:     Pick up stool softner and laxative for home. Do not submerge incision under water. May shower. Continue to use ice for pain and swelling from surgery.  Take Coumadin for three weeks and then discontinue.  The dose may need to be adjusted based upon the INR.  Please follow the INR and titrate Coumadin dose for a therapeutic range between 2.0 and 3.0 INR.  After completing the three weeks of Coumadin, the patient may stop the Coumadin and then take an 81 mg Aspirin daily for four more weeks.  Continue Lovenox injections until the INR is therapeutic at or greater than 2.0.  When INR reaches the therapeutic level of equal to or greater than 2.0, the patient may discontinue the Lovenox injections.  When discharged from the skilled rehab facility, please have  the facility set up the patient's [place] prior to being released.  Also provide the patient with their medications at time of release from the facility to include their pain medication, the muscle relaxants, and their blood thinner medication.  If the patient is still at the rehab facility at time of follow up appointment, please also assist the patient in arranging follow up appointment in our office and any transportation needs.   Do not put a pillow under the knee. Place it under the heel.  As directed    Do not sit on low chairs, stoools or toilet seats, as it may be difficult to get up from low surfaces  As directed    Driving restrictions  As directed    Comments:     No driving until released by the physician.   Increase activity slowly as tolerated  As directed    Lifting restrictions  As directed    Comments:     No lifting until released by the physician.   Patient may shower  As directed    Comments:     You may shower without a dressing once there is no drainage.  Do not wash over the wound.  If drainage remains, do not shower until drainage stops.   TED hose  As directed    Comments:     Use stockings (TED hose) for 3 weeks on both leg(s).  You may remove them at night for sleeping.   Weight bearing as tolerated  As directed    Questions:     Laterality:     Extremity:         Medication List    STOP taking these medications       CIALIS 20 MG tablet  Generic drug:  tadalafil     meloxicam 15 MG tablet  Commonly known as:  MOBIC  TAKE these medications       acetaminophen 325 MG tablet  Commonly known as:  TYLENOL  Take 2 tablets (650 mg total) by mouth every 6 (six) hours as needed for mild pain (or Fever >/= 101).     bisacodyl 10 MG suppository  Commonly known as:  DULCOLAX  Place 1 suppository (10 mg total) rectally daily as needed for moderate constipation.     DSS 100 MG Caps  Take 100 mg by mouth 2 (two) times daily.      enoxaparin 30 MG/0.3ML injection  Commonly known as:  LOVENOX  Inject 0.3 mLs (30 mg total) into the skin every 12 (twelve) hours. Continue Lovenox injections until the INR is therapeutic at or greater than 2.0.  When INR reaches the therapeutic level of equal to or greater than 2.0, the patient may discontinue the Lovenox injections.     lisinopril-hydrochlorothiazide 10-12.5 MG per tablet  Commonly known as:  PRINZIDE,ZESTORETIC  Take 1 tablet by mouth every morning.     methocarbamol 500 MG tablet  Commonly known as:  ROBAXIN  Take 1 tablet (500 mg total) by mouth every 6 (six) hours as needed for muscle spasms.     metoCLOPramide 5 MG tablet  Commonly known as:  REGLAN  Take 1-2 tablets (5-10 mg total) by mouth every 8 (eight) hours as needed for nausea (if ondansetron (ZOFRAN) ineffective.).     ondansetron 4 MG tablet  Commonly known as:  ZOFRAN  Take 1 tablet (4 mg total) by mouth every 6 (six) hours as needed for nausea.     oxyCODONE 5 MG immediate release tablet  Commonly known as:  Oxy IR/ROXICODONE  Take 1-4 tablets (5-20 mg total) by mouth every 3 (three) hours as needed for moderate pain, severe pain or breakthrough pain.     polyethylene glycol packet  Commonly known as:  MIRALAX / GLYCOLAX  Take 17 g by mouth daily as needed for mild constipation.     traMADol 50 MG tablet  Commonly known as:  ULTRAM  Take 50-100 mg by mouth every 6 (six) hours as needed (pain).     warfarin 7.5 MG tablet  Commonly known as:  COUMADIN  Take 1 tablet (7.5 mg total) by mouth one time only at 6 PM. Take Coumadin for three weeks and then discontinue.  The dose may need to be adjusted based upon the INR.  Please follow the INR and titrate Coumadin dose for a therapeutic range between 2.0 and 3.0 INR.  After completing the three weeks of Coumadin, the patient may stop the Coumadin and then take an  81 mg Aspirin daily for four more weeks.       Follow-up Information   Follow up with  Gearlean Alf, MD In 2 weeks.   Specialty:  Orthopedic Surgery   Contact information:   [address] [place] [address] [phone]       Signed: Mickel Crow 07/12/2013, 9:47 AM

## 2013-07-12 NOTE — PMR Pre-admission (Signed)
PMR Admission Coordinator Pre-Admission Assessment  Patient: Jorge Gray is an 68 y.o., male MRN: [id number] DOB: 1946/04/22 Height: 6' (182.9 cm) Weight: 89.812 kg (198 lb)              Insurance Information HMO:     PPO:  yes     PCP:      IPA:      80/20:      OTHER: Open Access Plus PRIMARY: [employer] Managed      Policy#: [id number]      Subscriber:  Wife [name] CM Name: Rayetta Humphrey      Phone#: [phone]. 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 Date: 05/05/2013     Deduct: $1500 individual/$3500 family     Out of Pocket Max:  $3500 individual/$7000 fmaily  Life Max: none CIR: 80%      SNF: 80% Outpatient: 80%     Co-Pay: 20% Home Health: 80%      Co-Pay: 20% DME: 80%     Co-Pay: 20% Providers:  In network SECONDARY Medicare Part A      Policy#: [id number]      Subscriber:  self CM Name:       Phone#:      Fax#:  Pre-Cert#:       Employer:  Benefits:  Phone #:      Name:  Eff. Date:      Deduct:       Out of Pocket Max:       Life Max:  CIR:       SNF:  Outpatient:      Co-Pay:  Home Health:       Co-Pay:  DME:      Co-Pay:   Medicaid Application Date:       Case Manager:  Disability Application Date:       Case Worker:   Emergency Facilities manager Information   Name Relation Home Work Mobile   Veillon,Karen Spouse [phone]       Current Medical History  Patient Admitting Diagnosis: Total knee replacement postoperative day #4  Bilateral end-stage osteoarthritis of the knee   History of Present Illness: Jorge Gray is a 68 y.o. male with history of HTN, DDD, bilateral knee OA with failure of conservative therapy. He elected to undergo B-TKR on 07/08/13 by Dr. Maureen Ralphs. Post op WBAT and on coumadin for DVT prophylaxis. Follow up labs with leucocytosis as  well as ABLA. Epidural cath removes last pm. Therapies initiated and CIR recommended by rehab team. Ortho recommends coumadin X 4 weeks followed by 81 mg ASA x 4 weeks       Past Medical History  Past Medical History  Diagnosis Date  . Hypercholesteremia   . Hypertension   . DDD (degenerative disc disease)   . Eczema   . Asthma     as child  . Rash     abd  . History of nonmelanoma skin cancer     basel cell removed from scalp    Family History  family history is not on file.  Prior Rehab/Hospitalizations: history of 2 back surgeries with resultant mild right foot drop; no h/o rehabilitation   Current Medications  Current facility-administered medications:acetaminophen (TYLENOL) suppository 650 mg, 650 mg, Rectal, Q6H PRN, Dione Plover  Aluisio, MD;  acetaminophen (TYLENOL) tablet 650 mg, 650 mg, Oral, Q6H PRN, Loanne Drilling, MD, 650 mg at 07/10/13 0319;  bisacodyl (DULCOLAX) suppository 10 mg, 10 mg, Rectal, Daily PRN, Loanne Drilling, MD dextrose 5 %-0.9 % sodium chloride infusion, , Intravenous, Continuous, Loanne Drilling, MD, Last Rate: 20 mL/hr at 07/10/13 2016;  diphenhydrAMINE (BENADRYL) 12.5 MG/5ML elixir 12.5-25 mg, 12.5-25 mg, Oral, Q4H PRN, Loanne Drilling, MD;  diphenhydrAMINE (BENADRYL) capsule 25 mg, 25 mg, Oral, Q4H PRN, Einar Pheasant, MD;  diphenhydrAMINE (BENADRYL) injection 12.5 mg, 12.5 mg, Intravenous, Q4H PRN, Einar Pheasant, MD docusate sodium (COLACE) capsule 100 mg, 100 mg, Oral, BID, Loanne Drilling, MD, 100 mg at 07/12/13 0834;  enoxaparin (LOVENOX) injection 30 mg, 30 mg, Subcutaneous, Q12H, Loanne Drilling, MD, 30 mg at 07/12/13 0834;  menthol-cetylpyridinium (CEPACOL) lozenge 3 mg, 1 lozenge, Oral, PRN, Loanne Drilling, MD;  methocarbamol (ROBAXIN) 500 mg in dextrose 5 % 50 mL IVPB, 500 mg, Intravenous, Q6H PRN, Loanne Drilling, MD methocarbamol (ROBAXIN) tablet 500 mg, 500 mg, Oral, Q6H PRN, Loanne Drilling, MD, 500 mg at 07/12/13 3419;   metoCLOPramide (REGLAN) injection 10 mg, 10 mg, Intravenous, Q8H PRN, Einar Pheasant, MD;  metoCLOPramide (REGLAN) injection 5-10 mg, 5-10 mg, Intravenous, Q8H PRN, Loanne Drilling, MD;  metoCLOPramide (REGLAN) tablet 5-10 mg, 5-10 mg, Oral, Q8H PRN, Loanne Drilling, MD morphine 2 MG/ML injection 1-2 mg, 1-2 mg, Intravenous, Q1H PRN, Loanne Drilling, MD;  nalbuphine (NUBAIN) 20 MG/ML injection 5-10 mg, 5-10 mg, Intravenous, Q4H PRN, Einar Pheasant, MD;  nalbuphine (NUBAIN) injection 5-10 mg, 5-10 mg, Subcutaneous, Q4H PRN, Einar Pheasant, MD;  naloxone [facility]) 2 mg in dextrose 5 % 250 mL infusion, 1-4 mcg/kg/hr, Intravenous, Continuous PRN, Einar Pheasant, MD ondansetron [facility]) injection 4 mg, 4 mg, Intravenous, Q6H PRN, Loanne Drilling, MD;  ondansetron (ZOFRAN) injection 4 mg, 4 mg, Intravenous, Q8H PRN, Einar Pheasant, MD;  ondansetron [facility]) tablet 4 mg, 4 mg, Oral, Q6H PRN, Loanne Drilling, MD;  oxyCODONE (Oxy IR/ROXICODONE) immediate release tablet 5-20 mg, 5-20 mg, Oral, Q3H PRN, Loanne Drilling, MD, 10 mg at 07/12/13 1248 phenol (CHLORASEPTIC) mouth spray 1 spray, 1 spray, Mouth/Throat, PRN, Loanne Drilling, MD;  polyethylene glycol (MIRALAX / GLYCOLAX) packet 17 g, 17 g, Oral, Daily PRN, Loanne Drilling, MD, 17 g at 07/12/13 6222;  traMADol Janean Sark) tablet 50-100 mg, 50-100 mg, Oral, Q6H PRN, Loanne Drilling, MD, 100 mg at 07/10/13 2008;  warfarin (COUMADIN) tablet 2.5 mg, 2.5 mg, Oral, Once, Berkley Harvey, [facility] warfarin (COUMADIN) tablet 7.5 mg, 7.5 mg, Oral, ONCE-1800, Randall K Absher, RPH;  Warfarin - Pharmacist Dosing Inpatient, , Does not apply, q1800, Loanne Drilling, MD  Patients Current Diet: regular with thin liquids  Precautions / Restrictions Precautions Precautions: Fall;Knee Precautions/Special Needs: Weight Bearing Status Precaution Comments: instructed pt on B KI use for amb and proper application ; WBAT bilaterally Restrictions Weight  Bearing Restrictions: No RLE Weight Bearing: Weight bearing as tolerated LLE Weight Bearing: Weight bearing as tolerated Other Position/Activity Restrictions: WBAT   Prior Activity Level  Pt. 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  Of [facility] / Equipment Home Assistive Devices/Equipment: Shower chair with back  Prior Functional Level Prior Function Level of Independence: Independent  Current Functional Level Cognition  Overall Cognitive Status: Within Functional Limits  for tasks assessed Orientation Level: Oriented X4    Extremity Assessment (includes Sensation/Coordination)          ADLs       Mobility  Overal bed mobility: Needs Assistance Bed Mobility: Supine to Sit Supine to sit: Mod assist Sit to supine: Mod assist General bed mobility comments: cues for technique and assist for Bil LE    Transfers  Overall transfer level: Needs assistance Equipment used: Rolling walker (2 wheeled) Transfers: Sit to/from Stand Sit to Stand: +2 physical assistance;+2 safety/equipment;Max assist;Mod assist General transfer comment: mod from elevated BED, max from recliner x 2, pt able to walk legs back and out for transitions, requires Assist for getting trunk over legs.    Ambulation / Gait / Stairs / Wheelchair Mobility  Ambulation/Gait Ambulation/Gait assistance: +2 physical assistance;+2 safety/equipment;Min assist Ambulation Distance (Feet): 50 Feet (x2 the 80') Assistive device: Rolling walker (2 wheeled) Gait Pattern/deviations: Step-to pattern;Decreased step length - right;Decreased step length - left Gait velocity: decr General Gait Details: cues for posture, sequence and position from RW    Posture / Balance      Special needs/care consideration BiPAP/CPAP no CPM yes, currently set on 10 to 40 degrees Continuous Drip IV  no Dialysis  no         Life Vest  no Oxygen  no Special Bed   no Trach Size  no Wound Vac (area)  no       Skin  Pt. Recently with reported rash on left chest and has been using Aveeno cream to treat                             Bowel mgmt:  Last BM 07/12/13 Bladder mgmt: continent Diabetic mgmt  no     Previous Home Environment Living Arrangements: Spouse/significant other  Lives With: Spouse Available Help at Discharge: Family;Available PRN/intermittently (wife works and does not plan to take time off upon DC) Type of Home: House Home Layout: One level;Other (Comment) (has two steps to kitchen from main level) Home Access: Level entry (one small door sill) Bathroom Shower/Tub: Multimedia programmer: Handicapped height Bathroom Accessibility: Yes How Accessible: Accessible via walker [place]: No  Discharge Living Setting Plans for Discharge Living Setting: Patient's home Type of Home at Discharge: House Discharge Home Layout: One level;Other (Comment) (2 steps from main level to kitchen) Discharge Home Access: Level entry (small door sill per pt.) Discharge Bathroom Shower/Tub: Walk-in shower Discharge Bathroom Toilet: Handicapped height Discharge Bathroom Accessibility: Yes How Accessible: Accessible via walker Does the patient have any problems obtaining your medications?: No  Social/Family/Support Systems Patient Roles:  (Pt's daughter is Kin Chiropractor PTA) Anticipated Caregiver: Axil Nard Anticipated Caregiver's Contact Information: (c) [phone]; (w) [phone] Caregiver Availability: Evenings only Discharge Plan Discussed with Primary Caregiver: Yes Is Caregiver In Agreement with Plan?: Yes Does Caregiver/Family have Issues with Lodging/Transportation while Pt is in Rehab?: No    Goals/Additional Needs Patient/Family Goal for Rehab: mod (I) level for PT and PT; ST n/a Expected length of stay: 5-7 days Cultural Considerations: no Dietary Needs: regular, thin liquids Special Service Needs:  none Pt/Family Agrees to Admission and willing to participate: Yes Program Orientation Provided & Reviewed with Pt/Caregiver Including Roles  & Responsibilities: Yes   Decrease burden of Care through IP rehab admission: no   Possible need for SNF placement upon discharge: no   Patient Condition: This patient's condition remains as documented  in the consult dated 07/11/13, in which the Rehabilitation Physician determined and documented that the patient's condition is appropriate for intensive rehabilitative care in an inpatient rehabilitation facility. Will admit to inpatient rehab today.  Preadmission Screen Completed By:  Tressie Stalker , 07/12/2013 2:19 PM ______________________________________________________________________   Discussed status with Dr.  Naaman Plummer on 07/12/13 at  1416  and received telephone approval for admission today.  Admission Coordinator:  Gerlean Ren,  PT time  0981 Sudie Grumbling 07/12/13

## 2013-07-12 NOTE — Progress Notes (Signed)
   Subjective: 4 Days Post-Op Procedure(s) (LRB): TOTAL KNEE BILATERAL (Bilateral) Patient reports pain as mild.   Patient seen in rounds with Dr. Wynelle Link. Patient is well, and has had no acute complaints or problems Patient is ready to go to CIR  Objective: Vital signs in last 24 hours: Temp:  [97.3 F (36.3 C)-100.1 F (37.8 C)] 99.2 F (37.3 C) (03/10 0603) Pulse Rate:  [91-100] 91 (03/10 0603) Resp:  [18-20] 18 (03/10 0603) BP: (131-150)/(68-73) 131/73 mmHg (03/10 0603) SpO2:  [93 %-97 %] 94 % (03/10 0603)  Intake/Output from previous day:  Intake/Output Summary (Last 24 hours) at 07/12/13 0940 Last data filed at 07/12/13 0700  Gross per 24 hour  Intake   1080 ml  Output   1970 ml  Net   -890 ml    Intake/Output this shift:    Labs:  Recent Labs  07/10/13 0505 07/11/13 0416 07/12/13 0427  HGB 11.2* 10.2* 10.0*    Recent Labs  07/11/13 0416 07/12/13 0427  WBC 13.6* 10.8*  RBC 3.84* 3.67*  HCT 31.6* 29.7*  PLT 180 206    Recent Labs  07/10/13 0505 07/11/13 0416  NA 138 138  K 4.4 3.8  CL 102 101  CO2 26 27  BUN 16 12  CREATININE 0.81 0.78  GLUCOSE 134* 97  CALCIUM 8.7 8.1*    Recent Labs  07/11/13 0416 07/12/13 0427  INR 1.43 1.25    EXAM: General - Patient is Alert, Appropriate and Oriented Extremity - Neurovascular intact Sensation intact distally Dorsiflexion/Plantar flexion intact Incision - clean, dry, no drainage, healing to both knees Motor Function - intact, moving feet and toes well on exam.   Assessment/Plan: 4 Days Post-Op Procedure(s) (LRB): TOTAL KNEE BILATERAL (Bilateral) Procedure(s) (LRB): TOTAL KNEE BILATERAL (Bilateral) Past Medical History  Diagnosis Date  . Hypercholesteremia   . Hypertension   . DDD (degenerative disc disease)   . Eczema   . Asthma     as child  . Rash     abd  . History of nonmelanoma skin cancer     basel cell removed from scalp   Principal Problem:   OA (osteoarthritis) of  knee Active Problems:   Postoperative anemia due to acute blood loss   Unspecified essential hypertension   Pure hypercholesterolemia  Estimated body mass index is 26.85 kg/(m^2) as calculated from the following:   Height as of this encounter: 6' (1.829 m).   Weight as of this encounter: 89.812 kg (198 lb). Discharge to CIR Diet - Cardiac diet Follow up - in 2 weeks Activity - WBAT Disposition - Rehab Condition Upon Discharge - Good D/C Meds - See DC Summary DVT Prophylaxis - Lovenox and Coumadin  PERKINS, ALEXZANDREW 07/12/2013, 9:40 AM

## 2013-07-12 NOTE — Progress Notes (Signed)
Physical Therapy Treatment Patient Details Name: Jorge Gray MRN: [id number] DOB: [birth year] Today's Date: 07/12/2013 Time: 2878-6767 PT Time Calculation (min): 33 min  PT Assessment / Plan / Recommendation  History of Present Illness 68 yo male s/p bil TKR   PT Comments   Pt c/o dizziness after each of 3 walks. BP 168/76 after first 50'. Plans DC to CIR today.  Follow Up Recommendations  CIR     Does the patient have the potential to tolerate intense rehabilitation     Barriers to Discharge        Equipment Recommendations  None recommended by PT    Recommendations for Other Services    Frequency 7X/week   Progress towards PT Goals Progress towards PT goals: Progressing toward goals  Plan Current plan remains appropriate    Precautions / Restrictions Precautions Precautions: Fall;Knee Precaution Comments: instructed pt on B KI use for amb and proper application ; WBAT bilaterally Required Braces or Orthoses: Knee Immobilizer - Left;Knee Immobilizer - Right Knee Immobilizer - Right: Discontinue once straight leg raise with < 10 degree lag Knee Immobilizer - Left: Discontinue once straight leg raise with < 10 degree lag Restrictions RLE Weight Bearing: Weight bearing as tolerated LLE Weight Bearing: Weight bearing as tolerated   Pertinent Vitals/Pain Each 4-5 -premedicated.    Mobility  Bed Mobility Overal bed mobility: Needs Assistance Bed Mobility: Supine to Sit Supine to sit: Mod assist General bed mobility comments: cues for technique and assist for Bil LE Transfers Overall transfer level: Needs assistance Equipment used: Rolling walker (2 wheeled) Transfers: Sit to/from Stand Sit to Stand: +2 physical assistance;+2 safety/equipment;Max assist;Mod assist General transfer comment: mod from elevated BED, max from recliner x 2, pt able to walk legs back and out for transitions, requires Assist for getting trunk over legs. Ambulation/Gait Ambulation/Gait  assistance: +2 physical assistance;+2 safety/equipment;Min assist Ambulation Distance (Feet): 50 Feet (x2 the 80') Assistive device: Rolling walker (2 wheeled) Gait Pattern/deviations: Step-to pattern;Decreased step length - right;Decreased step length - left Gait velocity: decr General Gait Details: cues for posture, sequence and position from RW    Exercises     PT Diagnosis:    PT Problem List:   PT Treatment Interventions:     PT Goals (current goals can now be found in the care plan section)    Visit Information  Last PT Received On: 07/12/13 Assistance Needed: +2 History of Present Illness: 68 yo male s/p bil TKR    Subjective Data      Cognition  Cognition Arousal/Alertness: Awake/alert    Balance     End of Session PT - End of Session Equipment Utilized During Treatment: Gait belt;Left knee immobilizer;Right knee immobilizer Activity Tolerance: Patient tolerated treatment well Patient left: in chair;with call bell/phone within reach;with family/visitor present Nurse Communication: Mobility status   GP     Claretha Cooper 07/12/2013, 1:56 PM Tresa Endo PT [phone]

## 2013-07-12 NOTE — Progress Notes (Signed)
ANTICOAGULATION CONSULT NOTE - Follow up  Pharmacy Consult for Warfarin Indication: VTE prophylaxis  No Known Allergies  Patient Measurements: Height: 6' (182.9 cm) Weight: 198 lb (89.812 kg) IBW/kg (Calculated) : 77.6  Vital Signs: Temp: 99.2 F (37.3 C) (03/10 0603) BP: 131/73 mmHg (03/10 0603) Pulse Rate: 91 (03/10 0603)  Labs:  Recent Labs  07/10/13 0505 07/10/13 1803 07/11/13 0416 07/12/13 0427  HGB 11.2*  --  10.2* 10.0*  HCT 33.7*  --  31.6* 29.7*  PLT 195  --  180 206  LABPROT 19.5* 18.3* 17.1* 15.4*  INR 1.70* 1.57* 1.43 1.25  CREATININE 0.81  --  0.78  --     Estimated Creatinine Clearance: 99.7 ml/min (by C-G formula based on Cr of 0.78).  Warfarin doses as inpatient:  3/6: 4mg  3/7: Held 3/8: 2.5mg  ordered but not charted. 3/9: 6 mg   Assessment: 66 yoM s/p bilateral total knee arthroplasty 3/6.  Pharmacy was consulted to begin warfarin post-op for VTE prophylaxis.   Apparently warfarin was started on the evening of surgery with epidural catheter in place.  Warfarin dose on POD#1 was held.  INR on POD#2 was 1.7 - vitamin K 1mg  IV was administered.  Six hours later the INR was down to 1.57 and the epidural catheter was removed by anesthesiology at 7:25pm 3/8.  Prophylactic-dose Lovenox was started the morning of 3/9.  Reportedly tolerating regular diet.  H/H stable, pltc WNL.  No bleeding reported.  Orders noted to begin Lovenox 30 mg SQ q12h this AM (approximately 12 hours after epidural catheter removal).  INR subtherapeutic, falling.  Goal of Therapy:  INR 2-3 Monitor platelets by anticoagulation protocol: Yes   Plan:  1) Warfarin 7.5 mg tonight at 1800 2) Daily PT/INR while inpatient 3) Prophylactic-dose Lovenox until INR therapeutic on warfarin  Clayburn Pert, PharmD, BCPS Pager: [phone] 07/12/2013  8:00 AM

## 2013-07-12 NOTE — Discharge Instructions (Signed)
° °Dr. Frank Aluisio °Total Joint Specialist °[facility] °[address] °[place], [place] [address] °[phone] ° °TOTAL KNEE REPLACEMENT POSTOPERATIVE DIRECTIONS ° ° ° °Knee Rehabilitation, Guidelines Following Surgery  °Results after knee surgery are often greatly improved when you follow the exercise, range of motion and muscle strengthening exercises prescribed by your doctor. Safety measures are also important to protect the knee from further injury. Any time any of these exercises cause you to have increased pain or swelling in your knee joint, decrease the amount until you are comfortable again and slowly increase them. If you have problems or questions, call your caregiver or physical therapist for advice.  ° °HOME CARE INSTRUCTIONS  °Remove items at home which could result in a fall. This includes throw rugs or furniture in walking pathways.  °Continue medications as instructed at time of discharge. °You may have some home medications which will be placed on hold until you complete the course of blood thinner medication.  °You may start showering once you are discharged home but do not submerge the incision under water. Just pat the incision dry and apply a dry gauze dressing on daily. °Walk with walker as instructed.  °You may resume a sexual relationship in one month or when given the OK by  your doctor.  °· Use walker as long as suggested by your caregivers. °· Avoid periods of inactivity such as sitting longer than an hour when not asleep. This helps prevent blood clots.  °You may put full weight on your legs and walk as much as is comfortable.  °You may return to work once you are cleared by your doctor.  °Do not drive a car for 6 weeks or until released by you surgeon.  °· Do not drive while taking narcotics.  °Wear the elastic stockings for three weeks following surgery during the day but you may remove then at night. °Make sure you keep all of your appointments after your  operation with all of your doctors and caregivers. You should call the office at the above phone number and make an appointment for approximately two weeks after the date of your surgery. °Change the dressing daily and reapply a dry dressing each time. °Please pick up a stool softener and laxative for home use as long as you are requiring pain medications. °· Continue to use ice on the knee for pain and swelling from surgery. You may notice swelling that will progress down to the foot and ankle.  This is normal after surgery.  Elevate the leg when you are not up walking on it.   °It is important for you to complete the blood thinner medication as prescribed by your doctor. °· Continue to use the breathing machine which will help keep your temperature down.  It is common for your temperature to cycle up and down following surgery, especially at night when you are not up moving around and exerting yourself.  The breathing machine keeps your lungs expanded and your temperature down. ° °RANGE OF MOTION AND STRENGTHENING EXERCISES  °Rehabilitation of the knee is important following a knee injury or an operation. After just a few days of immobilization, the muscles of the thigh which control the knee become weakened and shrink (atrophy). Knee exercises are designed to build up the tone and strength of the thigh muscles and to improve knee motion. Often times heat used for twenty to thirty minutes before working out will loosen up your tissues and help with improving the   range of motion but do not use heat for the first two weeks following surgery. These exercises can be done on a training (exercise) mat, on the floor, on a table or on a bed. Use what ever works the best and is most comfortable for you Knee exercises include:  °Leg Lifts - While your knee is still immobilized in a splint or cast, you can do straight leg raises. Lift the leg to 60 degrees, hold for 3 sec, and slowly lower the leg. Repeat 10-20 times 2-3  times daily. Perform this exercise against resistance later as your knee gets better.  °Quad and Hamstring Sets - Tighten up the muscle on the front of the thigh (Quad) and hold for 5-10 sec. Repeat this 10-20 times hourly. Hamstring sets are done by pushing the foot backward against an object and holding for 5-10 sec. Repeat as with quad sets.  °A rehabilitation program following serious knee injuries can speed recovery and prevent re-injury in the future due to weakened muscles. Contact your doctor or a physical therapist for more information on knee rehabilitation.  ° °SKILLED REHAB INSTRUCTIONS: °If the patient is transferred to a skilled rehab facility following release from the hospital, a list of the current medications will be sent to the facility for the patient to continue.  When discharged from the skilled rehab facility, please have the facility set up the patient's Home Health Physical Therapy prior to being released. Also, the skilled facility will be responsible for providing the patient with their medications at time of release from the facility to include their pain medication, the muscle relaxants, and their blood thinner medication. If the patient is still at the rehab facility at time of the two week follow up appointment, the skilled rehab facility will also need to assist the patient in arranging follow up appointment in our office and any transportation needs. ° °MAKE SURE YOU:  °Understand these instructions.  °Will watch your condition.  °Will get help right away if you are not doing well or get worse.  ° ° °Pick up stool softner and laxative for home. °Do not submerge incision under water. °May shower. °Continue to use ice for pain and swelling from surgery. ° °Take Coumadin for three weeks and then discontinue.  The dose may need to be adjusted based upon the INR.  Please follow the INR and titrate Coumadin dose for a therapeutic range between 2.0 and 3.0 INR.  After completing the three  weeks of Coumadin, the patient may stop the Coumadin and then take an 81 mg Aspirin daily for four more weeks. ° °Continue Lovenox injections until the INR is therapeutic at or greater than 2.0.  When INR reaches the therapeutic level of equal to or greater than 2.0, the patient may discontinue the Lovenox injections. ° °When discharged from the skilled rehab facility, please have the facility set up the patient's Home Health Physical Therapy prior to being released.  Also provide the patient with their medications at time of release from the facility to include their pain medication, the muscle relaxants, and their blood thinner medication.  If the patient is still at the rehab facility at time of follow up appointment, please also assist the patient in arranging follow up appointment in our office and any transportation needs. ° ° ° ° ° °Information on my medicine - Coumadin®   (Warfarin) ° °This medication education was reviewed with me or my healthcare representative as part of my discharge preparation.  The   pharmacist that spoke with me during my hospital stay was:  Pickering, Thomas Robert, RPH ° °Why was Coumadin prescribed for you? °Coumadin was prescribed for you because you have a blood clot or a medical condition that can cause an increased risk of forming blood clots. Blood clots can cause serious health problems by blocking the flow of blood to the heart, lung, or brain. Coumadin can prevent harmful blood clots from forming. °As a reminder your indication for Coumadin is:   Blood Clot Prevention After Orthopedic Surgery ° °What test will check on my response to Coumadin? °While on Coumadin (warfarin) you will need to have an INR test regularly to ensure that your dose is keeping you in the desired range. The INR (international normalized ratio) number is calculated from the result of the laboratory test called prothrombin time (PT). ° °If an INR APPOINTMENT HAS NOT ALREADY BEEN MADE FOR YOU please  schedule an appointment to have this lab work done by your health care provider within 7 days. °Your INR goal is usually a number between:  2 to 3 or your provider may give you a more narrow range like 2-2.5.  Ask your health care provider during an office visit what your goal INR is. ° °What  do you need to  know  About  COUMADIN? °Take Coumadin (warfarin) exactly as prescribed by your healthcare provider about the same time each day.  DO NOT stop taking without talking to the doctor who prescribed the medication.  Stopping without other blood clot prevention medication to take the place of Coumadin may increase your risk of developing a new clot or stroke.  Get refills before you run out. ° °What do you do if you miss a dose? °If you miss a dose, take it as soon as you remember on the same day then continue your regularly scheduled regimen the next day.  Do not take two doses of Coumadin at the same time. ° °Important Safety Information °A possible side effect of Coumadin (Warfarin) is an increased risk of bleeding. You should call your healthcare provider right away if you experience any of the following: °  Bleeding from an injury or your nose that does not stop. °  Unusual colored urine (red or dark brown) or unusual colored stools (red or black). °  Unusual bruising for unknown reasons. °  A serious fall or if you hit your head (even if there is no bleeding). ° °Some foods or medicines interact with Coumadin® (warfarin) and might alter your response to warfarin. To help avoid this: °  Eat a balanced diet, maintaining a consistent amount of Vitamin K. °  Notify your provider about major diet changes you plan to make. °  Avoid alcohol or limit your intake to 1 drink for women and 2 drinks for men per day. °(1 drink is 5 oz. wine, 12 oz. beer, or 1.5 oz. liquor.) ° °Make sure that ANY health care provider who prescribes medication for you knows that you are taking Coumadin (warfarin).  Also make sure the  healthcare provider who is monitoring your Coumadin knows when you have started a new medication including herbals and non-prescription products. ° °Coumadin® (Warfarin)  Major Drug Interactions  °Increased Warfarin Effect Decreased Warfarin Effect  °Alcohol (large quantities) °Antibiotics (esp. Septra/Bactrim, Flagyl, Cipro) °Amiodarone (Cordarone) °Aspirin (ASA) °Cimetidine (Tagamet) °Megestrol (Megace) °NSAIDs (ibuprofen, naproxen, etc.) °Piroxicam (Feldene) °Propafenone (Rythmol SR) °Propranolol (Inderal) °Isoniazid (INH) °Posaconazole (Noxafil) Barbiturates (Phenobarbital) °Carbamazepine (Tegretol) °Chlordiazepoxide (Librium) °Cholestyramine (Questran) °  Griseofulvin °Oral Contraceptives °Rifampin °Sucralfate (Carafate) °Vitamin K  ° °Coumadin® (Warfarin) Major Herbal Interactions  °Increased Warfarin Effect Decreased Warfarin Effect  °Garlic °Ginseng °Ginkgo biloba Coenzyme Q10 °Green tea °St. John’s wort   ° °Coumadin® (Warfarin) FOOD Interactions  °Eat a consistent number of servings per week of foods HIGH in Vitamin K °(1 serving = ½ cup)  °Collards (cooked, or boiled & drained) °Kale (cooked, or boiled & drained) °Mustard greens (cooked, or boiled & drained) °Parsley *serving size only = ¼ cup °Spinach (cooked, or boiled & drained) °Swiss chard (cooked, or boiled & drained) °Turnip greens (cooked, or boiled & drained)  °Eat a consistent number of servings per week of foods MEDIUM-HIGH in Vitamin K °(1 serving = 1 cup)  °Asparagus (cooked, or boiled & drained) °Broccoli (cooked, boiled & drained, or raw & chopped) °Brussel sprouts (cooked, or boiled & drained) *serving size only = ½ cup °Lettuce, raw (green leaf, endive, romaine) °Spinach, raw °Turnip greens, raw & chopped  ° °These websites have more information on Coumadin (warfarin):  www.coumadin.com; °www.ahrq.gov/consumer/coumadin.htm; ° ° ° °

## 2013-07-12 NOTE — Progress Notes (Signed)
   CARE MANAGEMENT NOTE 07/12/2013  Patient:  Jorge Gray, Jorge Gray   Account Number:  [id number]  Date Initiated:  07/10/2013  Documentation initiated by:  [facility]  Subjective/Objective Assessment:   TOTAL KNEE BILATERAL (Bilateral)     Action/Plan:   CIR recommended   Anticipated DC Date:  07/12/2013   Anticipated DC Plan:  IP REHAB FACILITY      DC Planning Services  CM consult      Choice offered to / List presented to:             Status of service:  Completed, signed off Medicare Important Message given?   (If response is "NO", the following Medicare IM given date fields will be blank) Date Medicare IM given:   Date Additional Medicare IM given:    Discharge Disposition:  IP REHAB FACILITY  Per UR Regulation:  Reviewed for med. necessity/level of care/duration of stay  If discussed at [place] of Stay Meetings, dates discussed:    Comments:  07/12/2013 [place] liaison and approved for CIR. Waiting for bed information and scheduled transfer today. Jonnie Finner RN CCM Case Mgmt phone [phone]  07/10/2013 1000 NCM spoke to pt and desires CIR. Waiting insurance approval. Jonnie Finner RN CCM Case Mgmt phone [phone]

## 2013-07-12 NOTE — H&P (Signed)
Physical Medicine and Rehabilitation Admission H&P  CC: Bilateral knee OA s/p B-TKR  HPI: Jorge Gray is a 68 y.o. male with history of HTN, DDD, bilateral knee OA with failure of conservative therapy. She elected to undergo B-TKR on 07/08/13 by Dr. Maureen Ralphs. Post op WBAT and on coumadin for DVT prophylaxis. Follow up labs with leucocytosis as well as ABLA. Epidural cath removes last pm. Therapies initiated and CIR recommended by rehab team. Ortho recommends coumadin X 4 weeks followed by 81 mg ASA x 4 weeks  ROS  Past Medical History   Diagnosis  Date   .  Hypercholesteremia    .  Hypertension    .  DDD (degenerative disc disease)    .  Eczema    .  Asthma      as child   .  Rash      abd   .  History of nonmelanoma skin cancer      basel cell removed from scalp    Past Surgical History   Procedure  Laterality  Date   .  Back surgery       x 2   .  Nose surgery     .  Vasectomy     .  Hip arthroscopy       bilateral   .  Total knee arthroplasty  Bilateral  07/08/2013     Procedure: TOTAL KNEE BILATERAL; Surgeon: Gearlean Alf, MD; Location: [facility] ORS; Service: Orthopedics; Laterality: Bilateral;    History reviewed. No pertinent family history.  Social History: reports that he quit smoking about 15 years ago. He does not have any smokeless tobacco history on file. He reports that he drinks alcohol. He reports that he does not use illicit drugs.  Allergies: No Known Allergies  Medications Prior to Admission   Medication  Sig  Dispense  Refill   .  lisinopril-hydrochlorothiazide (PRINZIDE,ZESTORETIC) 10-12.5 MG per tablet  Take 1 tablet by mouth every morning.     .  traMADol (ULTRAM) 50 MG tablet  Take 50-100 mg by mouth every 6 (six) hours as needed (pain).     .  [DISCONTINUED] tadalafil (CIALIS) 20 MG tablet  Take 20 mg by mouth daily as needed for erectile dysfunction.     .  [DISCONTINUED] meloxicam (MOBIC) 15 MG tablet  Take 15 mg by mouth daily.      Home:  Home  Living  Family/patient expects to be discharged to:: Inpatient rehab  Living Arrangements: Spouse/significant other  Functional History:   Functional Status:  Mobility:  Min assist   Ambulation/Gait  Ambulation Distance (Feet): 60 Feet (30 x 2 one sitting rest break)  Gait velocity: decr  General Gait Details: cues for posture, sequence and position from RW   ADL:  ADL  Toilet Transfer: Simulated;+2 Total assistance;Moderate assistance  Toilet Transfer Method: Sit to stand  Equipment Used: Rolling walker  Transfers/Ambulation Related to ADLs: Simulated toilet transfer. Cues for technique for sit to stand and for hand placement  ADL Comments: Educated on AE but pt did not use. Pt can perform UB adls with set up and LB ADLs require assist x 2 for safety. Bathing, pt 50%; dressing, pt 20% without AE. Pt can use AE for bathing at this time and will benefit from AE for dressing as LE strength improves  Cognition:  Cognition  Overall Cognitive Status: Within Functional Limits for tasks assessed  Orientation Level: Oriented X4  Cognition  Arousal/Alertness: Awake/alert  Behavior  During Therapy: WFL for tasks assessed/performed  Overall Cognitive Status: Within Functional Limits for tasks assessed    Physical Exam:  Blood pressure 131/73, pulse 91, temperature 99.2 F (37.3 C), temperature source Oral, resp. rate 18, height 6' (1.829 m), weight 89.812 kg (198 lb), SpO2 94.00%.    Constitutional: He is oriented to person, place, and time. He appears well-developed and well-nourished.  HENT:  Head: Normocephalic and atraumatic.  Right Ear: External ear normal.  Left Ear: External ear normal.  Mouth/Throat: Oropharynx is clear and moist.  Eyes: Conjunctivae and EOM are normal. Pupils are equal, round, and reactive to light.  Cardiovascular: Normal rate, regular rhythm and normal heart sounds.  Respiratory: Effort normal and breath sounds normal. No wheezes, rales, or rhonchi GI:  Soft. Bowel sounds are normal.  Neurological: He is alert and oriented to person, place, and time. No sensory deficit.  Motor strength is 5/5 in bilateral deltoid, bicep, tricep, grip 1+ to 2-/5 bilateral hip flexors and knee extensors 4/5 left ankle dorsiflexor plantar flexor 3+/5 right ankle dorsiflexor plantar flexor  Skin: Skin is warm and dry. There is erythema.  Edema around bilateral knee incision which is improving. Minimal to no drainage. Psychiatric: He has a normal mood and affect.    Results for orders placed during the hospital encounter of 07/08/13 (from the past 48 hour(s))   PROTIME-INR Status: Abnormal    Collection Time    07/10/13 6:03 PM   Result  Value  Ref Range    Prothrombin Time  18.3 (*)  11.6 - 15.2 seconds    INR  1.57 (*)  0.00 - 1.49   CBC Status: Abnormal    Collection Time    07/11/13 4:16 AM   Result  Value  Ref Range    WBC  13.6 (*)  4.0 - 10.5 K/uL    RBC  3.84 (*)  4.22 - 5.81 MIL/uL    Hemoglobin  10.2 (*)  13.0 - 17.0 g/dL    HCT  31.6 (*)  39.0 - 52.0 %    MCV  82.3  78.0 - 100.0 fL    MCH  26.6  26.0 - 34.0 pg    MCHC  32.3  30.0 - 36.0 g/dL    RDW  14.1  11.5 - 15.5 %    Platelets  180  150 - 400 K/uL   BASIC METABOLIC PANEL Status: Abnormal    Collection Time    07/11/13 4:16 AM   Result  Value  Ref Range    Sodium  138  137 - 147 mEq/L    Potassium  3.8  3.7 - 5.3 mEq/L    Chloride  101  96 - 112 mEq/L    CO2  27  19 - 32 mEq/L    Glucose, Bld  97  70 - 99 mg/dL    BUN  12  6 - 23 mg/dL    Creatinine, Ser  0.78  0.50 - 1.35 mg/dL    Calcium  8.1 (*)  8.4 - 10.5 mg/dL    GFR calc non Af Amer  >90  >90 mL/min    GFR calc Af Amer  >90  >90 mL/min    Comment:  (NOTE)     The eGFR has been calculated using the CKD EPI equation.     This calculation has not been validated in all clinical situations.     eGFR's persistently <90 mL/min signify possible Chronic Kidney  Disease.   PROTIME-INR Status: Abnormal    Collection Time      07/11/13 4:16 AM   Result  Value  Ref Range    Prothrombin Time  17.1 (*)  11.6 - 15.2 seconds    INR  1.43  0.00 - 1.49   CBC Status: Abnormal    Collection Time    07/12/13 4:27 AM   Result  Value  Ref Range    WBC  10.8 (*)  4.0 - 10.5 K/uL    RBC  3.67 (*)  4.22 - 5.81 MIL/uL    Hemoglobin  10.0 (*)  13.0 - 17.0 g/dL    HCT  29.7 (*)  39.0 - 52.0 %    MCV  80.9  78.0 - 100.0 fL    MCH  27.2  26.0 - 34.0 pg    MCHC  33.7  30.0 - 36.0 g/dL    RDW  13.9  11.5 - 15.5 %    Platelets  206  150 - 400 K/uL   PROTIME-INR Status: Abnormal    Collection Time    07/12/13 4:27 AM   Result  Value  Ref Range    Prothrombin Time  15.4 (*)  11.6 - 15.2 seconds    INR  1.25  0.00 - 1.49    No results found.  Post Admission Physician Evaluation:  1. Functional deficits secondary to bilateral endstage OA of both knees s/p bilateral TKA's. 2. Patient is admitted to receive collaborative, interdisciplinary care between the physiatrist, rehab nursing staff, and therapy team. 3. Patient's level of medical complexity and substantial therapy needs in context of that medical necessity cannot be provided at a lesser intensity of care such as a SNF. 4. Patient has experienced substantial functional loss from his/her baseline which was documented above under the "Functional History" and "Functional Status" headings. Judging by the patient's diagnosis, physical exam, and functional history, the patient has potential for functional progress which will result in measurable gains while on inpatient rehab. These gains will be of substantial and practical use upon discharge in facilitating mobility and self-care at the household level. 5. Physiatrist will provide 24 hour management of medical needs as well as oversight of the therapy plan/treatment and provide guidance as appropriate regarding the interaction of the two. 6. 24 hour rehab nursing will assist with bladder management, bowel management, safety,  skin/wound care, disease management, medication administration, pain management and patient education and help integrate therapy concepts, techniques,education, etc. 7. PT will assess and treat for/with: Lower extremity strength, range of motion, stamina, balance, functional mobility, safety, adaptive techniques and equipment, knee rom, pain mgt, quad strength. Goals are: mod I. 8. OT will assess and treat for/with: ADL's, functional mobility, safety, upper extremity strength, adaptive techniques and equipment, pain mgt. Goals are: mod I. 9. SLP will assess and treat for/with: n/a. Goals are: n/a. 10. Case Management and Social Worker will assess and treat for psychological issues and discharge planning. 11. Team conference will be held weekly to assess progress toward goals and to determine barriers to discharge. 12. Patient will receive at least 3 hours of therapy per day at least 5 days per week. 13. ELOS: 7 days  14. Prognosis: excellent   Medical Problem List and Plan:  1. DVT Prophylaxis/Anticoagulation: Pharmaceutical: Coumadin--INR till coumadin therapeutic.  2. Pain Management: oxycodone for breakthrough pain, will use 5-53m initially q3 prn.  Seems to be a little sensitive to narcs but has working to find an "appropriate"  balance.  No oxycontin   3. Mood: LCSW to follow for evaluation and support.  4. Neuropsych: This patient is capable of making decisions on his own behalf.  5. ABLA: Add iron supplement. Check serial labwork 6. Orthostasis: Push po fluids. Hold BP medications for now and check orthostatic vitals.   Meredith Staggers, MD, [facility]   07/12/2013

## 2013-07-13 ENCOUNTER — Inpatient Hospital Stay (HOSPITAL_COMMUNITY): Payer: Managed Care, Other (non HMO) | Admitting: Occupational Therapy

## 2013-07-13 ENCOUNTER — Inpatient Hospital Stay (HOSPITAL_COMMUNITY): Payer: Managed Care, Other (non HMO)

## 2013-07-13 DIAGNOSIS — D62 Acute posthemorrhagic anemia: Secondary | ICD-10-CM | POA: Diagnosis not present

## 2013-07-13 DIAGNOSIS — M1710 Unilateral primary osteoarthritis, unspecified knee: Secondary | ICD-10-CM

## 2013-07-13 DIAGNOSIS — I10 Essential (primary) hypertension: Secondary | ICD-10-CM

## 2013-07-13 DIAGNOSIS — Z96659 Presence of unspecified artificial knee joint: Secondary | ICD-10-CM

## 2013-07-13 DIAGNOSIS — IMO0002 Reserved for concepts with insufficient information to code with codable children: Secondary | ICD-10-CM | POA: Diagnosis not present

## 2013-07-13 LAB — CBC WITH DIFFERENTIAL/PLATELET
Basophils Absolute: 0 10*3/uL (ref 0.0–0.1)
Basophils Relative: 0 % (ref 0–1)
Eosinophils Absolute: 0.3 10*3/uL (ref 0.0–0.7)
Eosinophils Relative: 3 % (ref 0–5)
HCT: 29.9 % — ABNORMAL LOW (ref 39.0–52.0)
HEMOGLOBIN: 10.2 g/dL — AB (ref 13.0–17.0)
LYMPHS ABS: 1.5 10*3/uL (ref 0.7–4.0)
Lymphocytes Relative: 19 % (ref 12–46)
MCH: 27.4 pg (ref 26.0–34.0)
MCHC: 34.1 g/dL (ref 30.0–36.0)
MCV: 80.4 fL (ref 78.0–100.0)
Monocytes Absolute: 1.1 10*3/uL — ABNORMAL HIGH (ref 0.1–1.0)
Monocytes Relative: 13 % — ABNORMAL HIGH (ref 3–12)
NEUTROS ABS: 5.3 10*3/uL (ref 1.7–7.7)
NEUTROS PCT: 65 % (ref 43–77)
Platelets: 221 10*3/uL (ref 150–400)
RBC: 3.72 MIL/uL — AB (ref 4.22–5.81)
RDW: 13.6 % (ref 11.5–15.5)
WBC: 8.2 10*3/uL (ref 4.0–10.5)

## 2013-07-13 LAB — COMPREHENSIVE METABOLIC PANEL
ALT: 17 U/L (ref 0–53)
AST: 23 U/L (ref 0–37)
Albumin: 2.3 g/dL — ABNORMAL LOW (ref 3.5–5.2)
Alkaline Phosphatase: 49 U/L (ref 39–117)
BILIRUBIN TOTAL: 0.5 mg/dL (ref 0.3–1.2)
BUN: 13 mg/dL (ref 6–23)
CHLORIDE: 99 meq/L (ref 96–112)
CO2: 26 meq/L (ref 19–32)
CREATININE: 0.71 mg/dL (ref 0.50–1.35)
Calcium: 8.5 mg/dL (ref 8.4–10.5)
GFR calc Af Amer: >90 mL/min (ref >90)
GFR calc non Af Amer: >90 mL/min (ref >90)
Glucose, Bld: 115 mg/dL — ABNORMAL HIGH (ref 70–99)
Potassium: 3.6 mEq/L — ABNORMAL LOW (ref 3.7–5.3)
Sodium: 137 mEq/L (ref 137–147)
Total Protein: 5.4 g/dL — ABNORMAL LOW (ref 6.0–8.3)

## 2013-07-13 LAB — PROTIME-INR
INR: 1.38 (ref 0.00–1.49)
PROTHROMBIN TIME: 16.6 s — AB (ref 11.6–15.2)

## 2013-07-13 MED ORDER — WARFARIN SODIUM 7.5 MG PO TABS
7.5000 mg | ORAL_TABLET | Freq: Once | ORAL | Status: AC
  Administered 2013-07-13: 7.5 mg via ORAL
  Filled 2013-07-13: qty 1

## 2013-07-13 NOTE — Evaluation (Signed)
Occupational Therapy Assessment and Plan  Patient Details  Name: Jorge Gray MRN: [id number] Date of Birth: Feb 03, 1946  OT Diagnosis: acute pain, muscle weakness (generalized), swelling of BLEs and decreased B knee ROM Rehab Potential: Rehab Potential: Good ELOS: 7 days   Today's Date: 07/13/2013 Time: 1005 -1105  Time Calculation (min): 60 min  Problem List:  Patient Active Problem List   Diagnosis Date Noted  . DJD (degenerative joint disease) of knee 07/12/2013  . Postoperative anemia due to acute blood loss 07/11/2013  . Unspecified essential hypertension 07/11/2013  . Pure hypercholesterolemia 07/11/2013  . OA (osteoarthritis) of knee 07/08/2013    Past Medical History:  Past Medical History  Diagnosis Date  . Hypercholesteremia   . Hypertension   . DDD (degenerative disc disease)   . Eczema   . Asthma     as child  . Rash     abd  . History of nonmelanoma skin cancer     basel cell removed from scalp   Past Surgical History:  Past Surgical History  Procedure Laterality Date  . Back surgery      x 2  . Nose surgery    . Vasectomy    . Hip arthroscopy      bilateral  . Total knee arthroplasty Bilateral 07/08/2013    Procedure: TOTAL KNEE BILATERAL;  Surgeon: Gearlean Alf, MD;  Location: [facility];  Service: Orthopedics;  Laterality: Bilateral;    Assessment & Plan Clinical Impression:    Jorge Gray is a 68 y.o. male with history of HTN, DDD, bilateral knee OA with failure of conservative therapy. She elected to undergo B-TKR on 07/08/13 by Dr. Maureen Ralphs. Post op WBAT and on coumadin for DVT prophylaxis. Follow up labs with leucocytosis as well as ABLA. Epidural cath removes last pm. Therapies initiated and CIR recommended by rehab team. Ortho recommends coumadin X 4 weeks followed by 81 mg ASA x 4 weeks. Patient transferred to CIR on 07/12/2013 .   Patient currently requires min-supervision with basic self-care skills and IADL secondary to muscle  weakness, muscle joint tightness and pain and decreased standing balance.  Patient is retired and prior to hospitalization was active act home and in the community, driving, golfing, housekeeping secondary to wife still working.  Patient will benefit from skilled intervention to increase independence with basic self-care skills and increase level of independence with iADL prior to discharge home independently.  Anticipate patient will require intermittent supervision and no further OT follow recommended.  OT - End of Session Activity Tolerance: Tolerates 30+ min activity with multiple rests Endurance Deficit: Yes OT Assessment Rehab Potential: Good Barriers to Discharge: Decreased caregiver support Barriers to Discharge Comments: wife works days and her work sometimes takes her out of town OT Patient demonstrates impairments in the following area(s): Balance;Edema;Endurance;Pain;Safety OT Basic ADL's Functional Problem(s): Grooming;Bathing;Dressing;Toileting OT Advanced ADL's Functional Problem(s): Light Housekeeping (Simple Snack Prep) OT Transfers Functional Problem(s): Toilet;Tub/Shower OT Plan OT Intensity: Minimum of 1-2 x/day, 45 to 90 minutes OT Frequency: 5 out of 7 days OT Duration/Estimated Length of Stay: 7 days OT Treatment/Interventions: Balance/vestibular training;Community reintegration;DME/adaptive equipment instruction;Discharge planning;Functional mobility training;Patient/family education;Pain management;Self Care/advanced ADL retraining;Therapeutic Activities OT Basic Self-Care Anticipated Outcome(s): Mod I with AE PRN OT Toileting Anticipated Outcome(s): Mod I  OT Bathroom Transfers Anticipated Outcome(s): Mod I for toilet transfer, Supervision for shower transfer with DME  Skilled Therapeutic Intervention OT evaluation and self care retraining to include shower, dress, groom and toileting.  Focused session  on activity tolerance, safe bathroom transfers, LB bath and  dress with use of AE.  Issued LH sponge to improve independence with LB bathing, patient able to donn socks without sock aid and used LH shoe horn to assist with shoes.  Anticipate her will need 3in1 over commode at home.  OT Evaluation Precautions/Restrictions  Precautions Precautions: Fall;Knee Precaution Comments: instructed pt on B KI use for amb and proper application ; WBAT bilaterally Required Braces or Orthoses: Knee Immobilizer - Left;Knee Immobilizer - Right Knee Immobilizer - Right: Discontinue once straight leg raise with < 10 degree lag Knee Immobilizer - Left: Discontinue once straight leg raise with < 10 degree lag Restrictions Weight Bearing Restrictions: No RLE Weight Bearing: Weight bearing as tolerated LLE Weight Bearing: Weight bearing as tolerated Pain 4-5/10 B knees, premedicated, rest, repositioned Home Living/Prior Functioning Home Living Available Help at Discharge: Family;Available PRN/intermittently Type of Home: House Home Access: Level entry Home Layout: One level;Other (Comment) Additional Comments: Patient has walk in shower with door and low built in seat, has a shower chair that adjusts higher  Lives With: Spouse (Wife- [name]; Daughter- Maudie Mercury) Prior Function Level of Independence: Independent with basic ADLs;Independent with homemaking with ambulation;Independent with homemaking with wheelchair;Independent with gait;Independent with transfers  Able to Take Stairs?: No Driving: Yes Vocation: Retired Leisure: Hobbies-yes (Comment) Comments: Woodworking, golf, yardwork, handyman ADL Overall Min-Supervision, see FIM for details Vision/Perception  Vision - History Baseline Vision: Wears glasses only for reading Patient Visual Report: No change from baseline Perception Perception: Within Functional Limits Praxis Praxis: Intact  Cognition Overall Cognitive Status: Within Functional Limits for tasks assessed Arousal/Alertness: Awake/alert Orientation  Level: Oriented X4 Attention: Selective;Alternating Selective Attention: Appears intact Alternating Attention: Appears intact Memory: Appears intact Awareness: Appears intact Problem Solving: Appears intact Safety/Judgment: Appears intact Sensation Sensation Light Touch: Appears Intact Proprioception: Appears Intact Coordination Gross Motor Movements are Fluid and Coordinated: No Coordination and Movement Description: decreased due to pain and decreased ROM Motor  Motor Motor: Within Functional Limits Mobility  Bed Mobility Bed Mobility: Supine to Sit;Sit to Supine Supine to Sit: 4: Min assist Supine to Sit Details: Verbal cues for technique Sit to Supine: 3: Mod assist Sit to Supine - Details: Verbal cues for technique Transfers Sit to Stand: 4: Min assist Sit to Stand Details: Verbal cues for technique;Verbal cues for precautions/safety Stand to Sit: 4: Min assist Stand to Sit Details (indicate cue type and reason): Verbal cues for technique;Verbal cues for precautions/safety  Trunk/Postural Assessment  Cervical Assessment Cervical Assessment: Within Functional Limits Thoracic Assessment Thoracic Assessment: Within Functional Limits Lumbar Assessment Lumbar Assessment: Within Functional Limits Postural Control Postural Control: Within Functional Limits  Balance Balance Balance Assessed: Yes Static Sitting Balance Static Sitting - Balance Support: Bilateral upper extremity supported;Feet supported Static Sitting - Level of Assistance: 6: Modified independent (Device/Increase time) Dynamic Sitting Balance Dynamic Sitting - Balance Support: Left upper extremity supported;Right upper extremity supported;Bilateral upper extremity supported Dynamic Sitting - Level of Assistance: 6: Modified independent (Device/Increase time) Dynamic Sitting - Balance Activities: Forward lean/weight shifting;Lateral lean/weight shifting;Reaching for Consulting civil engineer  Standing - Balance Support: Bilateral upper extremity supported Static Standing - Level of Assistance: 5: Stand by assistance Dynamic Standing Balance Dynamic Standing - Balance Support: Bilateral upper extremity supported;During functional activity Dynamic Standing - Level of Assistance: 4: Min assist;5: Stand by assistance Dynamic Standing - Balance Activities: Forward lean/weight shifting;Lateral lean/weight shifting;Reaching for objects Extremity/Trunk Assessment RUE Assessment RUE Assessment: Within Functional Limits LUE Assessment LUE  Assessment: Within Functional Limits  FIM:  FIM - Grooming Grooming Steps: Wash, rinse, dry face;Wash, rinse, dry hands;Oral care, brush teeth, clean dentures;Brush, comb hair;Shave or apply make-up Grooming: 4: Steadying assist  or patient completes 3 of 4 or 4 of 5 steps FIM - Bathing Bathing Steps Patient Completed: Chest;Right Arm;Left Arm;Abdomen;Front perineal area;Right upper leg;Left upper leg Bathing: 3: Mod-Patient completes 5-7 [id number] parts or 50-74% FIM - Upper Body Dressing/Undressing Upper body dressing/undressing steps patient completed: Thread/unthread right sleeve of pullover shirt/dresss;Thread/unthread left sleeve of pullover shirt/dress;Put head through opening of pull over shirt/dress;Pull shirt over trunk Upper body dressing/undressing: 5: Set-up assist to: Obtain clothing/put away FIM - Lower Body Dressing/Undressing Lower body dressing/undressing steps patient completed: Thread/unthread right pants leg;Thread/unthread left pants leg;Pull pants up/down;Don/Doff right sock;Don/Doff left sock;Fasten/unfasten right shoe;Fasten/unfasten left shoe Lower body dressing/undressing: 4: Min-Patient completed 75 plus % of tasks FIM - Toileting Toileting steps completed by patient: Adjust clothing prior to toileting;Performs perineal hygiene;Adjust clothing after toileting Toileting Assistive Devices: Grab bar or rail for support Toileting:  4: Steadying assist FIM - BControl and instrumentation engineerDevices: Walker;Bed rails;Arm rests Bed/Chair Transfer: 4: Supine > Sit: Min A (steadying Pt. > 75%/lift 1 leg);3: Sit > Supine: Mod A (lifting assist/Pt. 50-74%/lift 2 legs);4: Bed > Chair or W/C: Min A (steadying Pt. > 75%);4: Chair or W/C > Bed: Min A (steadying Pt. > 75%) FIM - TRadio producerDevices: Grab bars (3 in 1 over commode) Toilet Transfers: 3-To toilet/BSC: Mod A (lift or lower assist);3-From toilet/BSC: Mod A (lift or lower assist) FIM - Tub/Shower Transfers Tub/Shower Assistive Devices: Walk in shower;Tub transfer bench;Grab bars Tub/shower Transfers: 3-Into Tub/Shower: Mod A (lift or lower/lift 2 legs);3-Out of Tub/Shower: Mod A (lift or lower/lift 2 legs)   Refer to Care Plan for Long Term Goals  Recommendations for other services: None  Discharge Criteria: Patient will be discharged from OT if patient refuses treatment 3 consecutive times without medical reason, if treatment goals not met, if there is a change in medical status, if patient makes no progress towards goals or if patient is discharged from hospital.  The above assessment, treatment plan, treatment alternatives and goals were discussed and mutually agreed upon: by patient  Ericberto Padget 07/13/2013, 12:33 PM

## 2013-07-13 NOTE — Progress Notes (Signed)
Subjective/Complaints: Had difficulty sleeping last night. Just restless. Pain under control. A 12 point review of systems has been performed and if not noted above is otherwise negative.   Objective: Vital Signs: Blood pressure 152/72, pulse 88, temperature 98.2 F (36.8 C), temperature source Oral, resp. rate 17, height 6' (1.829 m), weight 94.666 kg (208 lb 11.2 oz), SpO2 91.00%. No results found.  Recent Labs  07/12/13 0427 07/13/13 0625  WBC 10.8* 8.2  HGB 10.0* 10.2*  HCT 29.7* 29.9*  PLT 206 221    Recent Labs  07/11/13 0416 07/13/13 0625  NA 138 137  K 3.8 3.6*  CL 101 99  GLUCOSE 97 115*  BUN 12 13  CREATININE 0.78 0.71  CALCIUM 8.1* 8.5   CBG (last 3)  No results found for this basename: GLUCAP,  in the last 72 hours  Wt Readings from Last 3 Encounters:  07/13/13 94.666 kg (208 lb 11.2 oz)  07/08/13 89.812 kg (198 lb)  07/08/13 89.812 kg (198 lb)    Physical Exam:  Constitutional: He is oriented to person, place, and time. He appears well-developed and well-nourished.  HENT:  Head: Normocephalic and atraumatic.  Right Ear: External ear normal.  Left Ear: External ear normal.  Mouth/Throat: Oropharynx is clear and moist.  Eyes: Conjunctivae and EOM are normal. Pupils are equal, round, and reactive to light.  Cardiovascular: Normal rate, regular rhythm and normal heart sounds.  Respiratory: Effort normal and breath sounds normal. No wheezes, rales, or rhonchi  GI: Soft. Bowel sounds are normal.  Neurological: He is alert and oriented to person, place, and time. No sensory deficit.  Motor strength is 5/5 in bilateral deltoid, bicep, tricep, grip 1+ to 2-/5 bilateral hip flexors and knee extensors 4/5 left ankle dorsiflexor plantar flexor 3+/5 right ankle dorsiflexor plantar flexor  Skin: Skin is warm and dry. There is erythema.  Edema around bilateral knee incision which is improving. Minimal to no drainage. Psychiatric: He has a normal  mood and affect.  Knee ROM: almost 90 deg bilaterally with PROM     Assessment/Plan: 1. Functional deficits secondary to bilateral OA of knees s/p TKA's which require 3+ hours per day of interdisciplinary therapy in a comprehensive inpatient rehab setting. Physiatrist is providing close team supervision and 24 hour management of active medical problems listed below. Physiatrist and rehab team continue to assess barriers to discharge/monitor patient progress toward functional and medical goals. FIM: FIM - Bathing Bathing: 0: Activity did not occur  FIM - Upper Body Dressing/Undressing Upper body dressing/undressing: 0: Activity did not occur FIM - Lower Body Dressing/Undressing Lower body dressing/undressing: 0: Activity did not occur  FIM - Toileting Toileting: 1: Two helpers  FIM - Radio producer Devices: Recruitment consultant Transfers: 1-Two helpers  FIM - Control and instrumentation engineer Devices: Environmental consultant;Bed rails;Arm rests Bed/Chair Transfer: 1: Two helpers  FIM - Locomotion: Wheelchair Locomotion: Wheelchair: 0: Activity did not occur FIM - Locomotion: Ambulation Locomotion: Ambulation: 0: Activity did not occur  Comprehension Comprehension Mode: Auditory Comprehension: 7-Follows complex conversation/direction: With no assist  Expression Expression Mode: Verbal Expression: 7-Expresses complex ideas: With no assist  Social Interaction Social Interaction: 7-Interacts appropriately with others - No medications needed.  Problem Solving Problem Solving: 7-Solves complex problems: Recognizes & self-corrects  Memory Memory: 7-Complete Independence: No helper  Medical Problem List and Plan:  1. DVT Prophylaxis/Anticoagulation: Pharmaceutical: Coumadin--INR till coumadin therapeutic.  2. Pain Management: oxycodone for breakthrough  pain, will use 5-15mg  initially q3 prn.  3. Mood: LCSW to follow for evaluation and support.   4. Neuropsych: This patient is capable of making decisions on his own behalf.  5. ABLA: Added iron supplement. hgb 10.2   6. Orthostasis: Push po fluids. Holding BP medications for now. No orthostasis this morning     LOS (Days) 1 A FACE TO FACE EVALUATION WAS PERFORMED  Jorge Gray T 07/13/2013 7:55 AM

## 2013-07-13 NOTE — Progress Notes (Signed)
Patient notified that he has an order for bilateral CPM; per patient, he had been using the CPM on the prior unit.  Patient states he is going to sleep soon and that he will wear the CPM tomorrow.  Explained the purpose and importance of the CPM and patient continues to refuse the CPM tonight.  Will continue to monitor.

## 2013-07-13 NOTE — Progress Notes (Signed)
Explained the process for orthostatic vital signs to patient.  Patient agrees to have blood pressure taken while lying and sitting but currently refusing to stand.  Will continue to monitor.

## 2013-07-13 NOTE — Progress Notes (Signed)
Orthopedic Tech Progress Note Patient Details:  Jorge Gray 23-Jul-1945 [id number]  CPM Left Knee CPM Left Knee: On Left Knee Flexion (Degrees): 90 Left Knee Extension (Degrees): 0 CPM Right Knee CPM Right Knee: Off (Refused until tomorrow)   [name] 07/13/2013, 8:26 PM

## 2013-07-13 NOTE — Care Management Note (Signed)
Inpatient [facility] Statement of Services  Patient Name:  Jorge Gray  Date:  07/13/2013  Welcome to the [facility].  Our goal is to provide you with an individualized program based on your diagnosis and situation, designed to meet your specific needs.  With this comprehensive rehabilitation program, you will be expected to participate in at least 3 hours of rehabilitation therapies Monday-Friday, with modified therapy programming on the weekends.  Your rehabilitation program will include the following services:  Physical Therapy (PT), Occupational Therapy (OT), 24 hour per day rehabilitation nursing, Case Management (Social Worker), Rehabilitation Medicine, Nutrition Services and Pharmacy Services  Weekly team conferences will be held on Tuesday to discuss your progress.  Your Social Worker will talk with you frequently to get your input and to update you on team discussions.  Team conferences with you and your family in attendance may also be held.  Expected length of stay: 7 days  Overall anticipated outcome: mod/i level  Depending on your progress and recovery, your program may change. Your Social Worker will coordinate services and will keep you informed of any changes. Your Social Worker's name and contact numbers are listed  below.  The following services may also be recommended but are not provided by the [facility] will be made to provide these services after discharge if needed.  Arrangements include referral to agencies that provide these services.  Your insurance has been verified to be:  [place] & Medicare Your primary doctor is:  Dr Carlus Pavlov  Pertinent information will be shared with your doctor and your insurance company.  Social Worker:  Ovidio Kin, [place] or ([phone]  Information discussed with and copy given to patient by: Elease Hashimoto, 07/13/2013, 2:41 PM

## 2013-07-13 NOTE — Progress Notes (Signed)
Social Work Assessment and Plan Social Work Assessment and Plan  Patient Details  Name: Jorge Gray MRN: [id number] Date of Birth: [birth year]  Today's Date: 07/13/2013  Problem List:  Patient Active Problem List   Diagnosis Date Noted  . DJD (degenerative joint disease) of knee 07/12/2013  . Postoperative anemia due to acute blood loss 07/11/2013  . Unspecified essential hypertension 07/11/2013  . Pure hypercholesterolemia 07/11/2013  . OA (osteoarthritis) of knee 07/08/2013   Past Medical History:  Past Medical History  Diagnosis Date  . Hypercholesteremia   . Hypertension   . DDD (degenerative disc disease)   . Eczema   . Asthma     as child  . Rash     abd  . History of nonmelanoma skin cancer     basel cell removed from scalp   Past Surgical History:  Past Surgical History  Procedure Laterality Date  . Back surgery      x 2  . Nose surgery    . Vasectomy    . Hip arthroscopy      bilateral  . Total knee arthroplasty Bilateral 07/08/2013    Procedure: TOTAL KNEE BILATERAL;  Surgeon: Gearlean Alf, MD;  Location: [facility];  Service: Orthopedics;  Laterality: Bilateral;   Social History:  reports that he quit smoking about 15 years ago. He does not have any smokeless tobacco history on file. He reports that he drinks alcohol. He reports that he does not use illicit drugs.  Family / Support Systems Marital Status: Married Patient Roles: Spouse;Parent;Volunteer Spouse/Significant Other: karen  [phone]  [phone] Children: Shelle Iron and three more daughter's Other Supports: Friends Anticipated Caregiver: Wife and Pt Ability/Limitations of Caregiver: Wife works during the day and may be off couple of days-pt may be alone during the day Caregiver Availability: Evenings only Family Dynamics: Close knit family they ahve four daughter's all are supportive and involved in their lives.  Pt reports if he needs anything they will provide for this. Grateful for  his close family.  Social History Preferred language: English Religion: Baptist Cultural Background: No issues Education: [employer] Educated Read: Yes Write: Yes Employment Status: Retired Freight forwarder Issues: No issues Guardian/Conservator: None-according to MD pt is capable of making his own decisions while here   Abuse/Neglect Physical Abuse: Denies Verbal Abuse: Denies Sexual Abuse: Denies Exploitation of patient/patient's resources: Denies Self-Neglect: Denies  Emotional Status Pt's affect, behavior adn adjustment status: Pt is motivated to become independent again.  He is trying to push through the pain to do well and return home.  He realizes it will be painful but will get better. Recent Psychosocial Issues: Has been active and enjoying retirement Pyschiatric History: No history-deferred depression screen due to felt not necessary, pt doing well with his surgery and recovery Substance Abuse History: No issues  Patient / Family Perceptions, Expectations & Goals Pt/Family understanding of illness & functional limitations: Pt is able to explain his surgery and that it had to be done, but is glad the surgery part is over.  Now the healing begins and once he can get over this, he will be better than before.  He was having pain and was limited by his knees prior to surgery. Premorbid pt/family roles/activities: Husband, Father, Grandfather, Retiree, Home owner, Psychologist, occupational, Air cabin crew, etc Anticipated changes in roles/activities/participation: resume once healed Pt/family expectations/goals: Pt states: " I want to be independent by the time I leave here, my wife wil be there some, but that is my goal."  Community Resources [employer]: None Premorbid Home Care/DME Agencies: None Transportation available at discharge: Family  Discharge Planning Living Arrangements: Spouse/significant other Support Systems: Spouse/significant  other;Children;Friends/neighbors;Church/faith community Type of Residence: Private residence Insurance Resources: Education officer, museum (specify) Psychologist, counselling) Financial Resources: Fish farm manager;Family Support Financial Screen Referred: No Living Expenses: Lives with family Money Management: Spouse;Patient Does the patient have any problems obtaining your medications?: No Home Management: Both he and wife Patient/Family Preliminary Plans: Return home with wife who can be there for a few days but will return to work.  She will be there in the evenings and can assist.  He plans to go to OP therapies in [place] when discharged from here. Social Work Anticipated Follow Up Needs: HH/OP  Clinical Impression Pleasant gentleman who is motivated to improve and realizes he will experience pain with his surgery.  He had a good day but busy and is glad to be lying down now. His family is supportive and will assist if needed.  His goals are mod/i by discharge.  Will work on discharge plans.  Elease Hashimoto 07/13/2013, 2:53 PM

## 2013-07-13 NOTE — Evaluation (Addendum)
Physical Therapy Assessment and Plan  Patient Details  Name: Jorge Gray MRN: [id number] Date of Birth: 1945/08/03  PT Diagnosis: Difficulty walking, Edema, Muscle weakness, Osteoarthritis, Decreased ROM in B knees and Pain in B knees Rehab Potential: Excellent ELOS: 7-9 days   Today's Date: 07/13/2013 Time: Treatment Session 1: 7048-8891; Treatment Session 2: 1135-1200 Time Calculation (min): Treatment Session 1: 60 min; Treatment Session 2: 23mn  Problem List:  Patient Active Problem List   Diagnosis Date Noted  . DJD (degenerative joint disease) of knee 07/12/2013  . Postoperative anemia due to acute blood loss 07/11/2013  . Unspecified essential hypertension 07/11/2013  . Pure hypercholesterolemia 07/11/2013  . OA (osteoarthritis) of knee 07/08/2013    Past Medical History:  Past Medical History  Diagnosis Date  . Hypercholesteremia   . Hypertension   . DDD (degenerative disc disease)   . Eczema   . Asthma     as child  . Rash     abd  . History of nonmelanoma skin cancer     basel cell removed from scalp   Past Surgical History:  Past Surgical History  Procedure Laterality Date  . Back surgery      x 2  . Nose surgery    . Vasectomy    . Hip arthroscopy      bilateral  . 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 male with history of HTN, DDD, bilateral knee OA with failure of conservative therapy. She elected to undergo B-TKR on 07/08/13 by Dr. AMaureen Ralphs Post op WBAT and on coumadin for DVT prophylaxis. Follow up labs with leucocytosis as well as ABLA. Epidural cath removes last pm. Therapies initiated and CIR recommended by rehab team. Ortho recommends coumadin X 4 weeks followed by 81 mg ASA x 4 weeks. Patient transferred to CIR on 07/12/2013 .    Patient currently requires Min-mod A with mobility secondary to muscle weakness, decreased functional endurance and decreased standing balance and decreased B knee ROM.  Prior to hospitalization, patient was independent  with mobility and lived with Spouse in a House home.  Home access is  Level entry.  Patient will benefit from skilled PT intervention to maximize safe functional mobility, minimize fall risk and decrease caregiver burden for planned discharge home alone.  Anticipate patient will benefit from follow up OP at discharge.  PT - End of Session Activity Tolerance: Tolerates 30+ min activity with multiple rests Endurance Deficit: Yes PT Assessment Rehab Potential: Excellent PT Patient demonstrates impairments in the following area(s): Balance;Other (comment);Endurance;Pain;Safety;Skin Integrity;Edema (strength) PT Transfers Functional Problem(s): Bed Mobility;Bed to Chair;Car;Furniture PT Locomotion Functional Problem(s): Stairs;Wheelchair Mobility;Ambulation PT Plan PT Intensity: Minimum of 1-2 x/day ,45 to 90 minutes PT Frequency: 5 out of 7 days PT Duration Estimated Length of Stay: 7-9 days PT Treatment/Interventions: Ambulation/gait training;Balance/vestibular training;Discharge planning;Pain management;Skin care/wound management;Therapeutic Activities;UE/LE Coordination activities;UE/LE Strength taining/ROM;Stair tLocation managerNeuromuscular re-education;Wheelchair propulsion/positioning;Therapeutic Exercise;Patient/family education;Functional mobility training;Disease management/prevention PT Transfers Anticipated Outcome(s): Mod(I) PT Locomotion Anticipated Outcome(s): Mod(I) for locomotion in home environment, (S) for ambulation in community environment PT Recommendation Follow Up Recommendations: Outpatient PT Patient destination: Home Equipment Recommended: Rolling walker with 5" wheels;To be determined  Skilled Therapeutic  Intervention Treatment Session 1:  1:1. Pt received semi-reclined in bed, ready for therapy. PT evaluation performed, see  detailed objective information below. Pt educated on rehab process, goals for physical therapy, overall safety- pt verbalized understanding. Tx initiated w/ emphasis on safety during transfers/bed mobility, w/c propulsion, amb w/ use of RW and B TKR exercises. Pt slightly anxious throughout session. Pt sitting in w/c at end of session w/ all needs in reach.   Treatment Session 2: 1:1. Pt received transferring back to bed w/out assistance or use of RW. Education regarding safety and need to call for assistance. Focus this session on gait training and w/c propulsion for endurance. Following therapist demonstration, pt able to negotiate up/down 3, 3" steps w/ single rail and min A. Pt able to propel w/c room<>therapy gym w/ B UE to target functional endurance. Min A for amb door>bed and t/f sit>supine. Pt left w/ all needs in reach, bed alarm on.   PT Evaluation Precautions/Restrictions Precautions Precautions: Fall;Knee Precaution Comments: instructed pt on B KI use for amb and proper application ; WBAT bilaterally Required Braces or Orthoses: Knee Immobilizer - Left;Knee Immobilizer - Right Knee Immobilizer - Right: Discontinue once straight leg raise with < 10 degree lag Knee Immobilizer - Left: Discontinue once straight leg raise with < 10 degree lag Restrictions Weight Bearing Restrictions: No RLE Weight Bearing: Weight bearing as tolerated LLE Weight Bearing: Weight bearing as tolerated General Chart Reviewed: Yes Family/Caregiver Present: No Vital SignsTherapy Vitals Temp: 98.2 F (36.8 C) Temp src: Oral Pulse Rate: 88 Resp: 17 BP: 152/72 mmHg Patient Position, if appropriate: Lying Oxygen Therapy SpO2: 91 % O2 Device: None (Room air) Pain Pain Assessment Pain Assessment: 0-10 Pain Score: 5  Pain Type: Surgical pain Pain Location: Knee Pain Orientation:  Right;Left Pain Descriptors / Indicators: Aching Pain Frequency: Intermittent Pain Onset: Gradual Patients Stated Pain Goal: 3 Pain Intervention(s): Medication (See eMAR);Repositioned;Emotional support Multiple Pain Sites: No Home Living/Prior Functioning Home Living Available Help at Discharge: Family;Available PRN/intermittently Type of Home: House Home Access: Level entry Home Layout: One level;Other (Comment) Additional Comments: Patient has walk in shower with door and low built in seat, has a shower chair that adjusts higher  Lives With: Spouse Prior Function Level of Independence: Independent with basic ADLs;Independent with homemaking with ambulation;Independent with homemaking with wheelchair;Independent with gait;Independent with transfers  Able to Take Stairs?: No Driving: Yes Vocation: Retired Leisure: Hobbies-yes (Comment) Comments: Woodworking, golf, yardwork, handyman Vision/Perception  Vision - History Baseline Vision: Wears glasses only for reading Patient Visual Report: No change from baseline Perception Perception: Within Functional Limits Praxis Praxis: Intact  Cognition Overall Cognitive Status: Within Functional Limits for tasks assessed Arousal/Alertness: Awake/alert Orientation Level: Oriented X4 Attention: Selective;Alternating Selective Attention: Appears intact Alternating Attention: Appears intact Memory: Appears intact Awareness: Appears intact Problem Solving: Appears intact Safety/Judgment: Appears intact Sensation Sensation Light Touch: Appears Intact Proprioception: Appears Intact Coordination Gross Motor Movements are Fluid and Coordinated: No Coordination and Movement Description: decreased due to pain and decreased ROM Motor  Motor Motor: Within Functional Limits  Mobility Bed Mobility Bed Mobility: Supine to Sit;Sit to Supine Supine to Sit: 4: Min assist Supine to Sit Details: Verbal cues for technique Sit to Supine: 3: Mod  assist Sit to Supine - Details: Verbal cues for technique Transfers Transfers: Yes Sit to Stand: 4: Min assist Sit to Stand Details: Verbal cues for technique;Verbal cues for precautions/safety Stand to Sit: 4: Min assist Stand to Sit Details (indicate cue type and reason): Verbal cues for technique;Verbal cues for precautions/safety Stand Pivot Transfers: 4: Min assist Stand Pivot Transfer Details: Verbal cues for  technique;Verbal cues for precautions/safety;Verbal cues for safe use of DME/AE Locomotion  Ambulation Ambulation: Yes Ambulation/Gait Assistance: 4: Min assist Ambulation Distance (Feet): 50 Feet Assistive device: Rolling walker Ambulation/Gait Assistance Details: Verbal cues for safe use of DME/AE;Verbal cues for technique;Verbal cues for gait pattern;Verbal cues for precautions/safety Gait Gait: Yes Gait Pattern: Impaired Gait Pattern: Step-through pattern;Decreased step length - right;Decreased step length - left;Decreased hip/knee flexion - right;Decreased hip/knee flexion - left;Trunk flexed Gait velocity: decreased Stairs / Additional Locomotion Stairs:  (Not performed due to limited time) Architect: Yes Wheelchair Assistance: 5: Investment banker, operational Details: Verbal cues for Marketing executive: Both upper extremities Wheelchair Parts Management: Needs assistance Distance: 200'x2  Trunk/Postural Assessment  Cervical Assessment Cervical Assessment: Within Functional Limits Thoracic Assessment Thoracic Assessment: Within Functional Limits Lumbar Assessment Lumbar Assessment: Within Functional Limits Postural Control Postural Control: Within Functional Limits  Balance Balance Balance Assessed: Yes Static Sitting Balance Static Sitting - Balance Support: Bilateral upper extremity supported;Feet supported Static Sitting - Level of Assistance: 6: Modified independent (Device/Increase time) Dynamic Sitting  Balance Dynamic Sitting - Balance Support: Left upper extremity supported;Right upper extremity supported;Bilateral upper extremity supported Dynamic Sitting - Level of Assistance: 6: Modified independent (Device/Increase time) Dynamic Sitting - Balance Activities: Forward lean/weight shifting;Lateral lean/weight shifting;Reaching for Consulting civil engineer Standing - Balance Support: Bilateral upper extremity supported Static Standing - Level of Assistance: 5: Stand by assistance Dynamic Standing Balance Dynamic Standing - Balance Support: Bilateral upper extremity supported;During functional activity Dynamic Standing - Level of Assistance: 4: Min assist;5: Stand by assistance Dynamic Standing - Balance Activities: Forward lean/weight shifting;Lateral lean/weight shifting;Reaching for objects Extremity Assessment      RLE Assessment RLE Assessment: Exceptions to [facility] RLE AROM (degrees) RLE Overall AROM Comments: Knee Flex: 90deg; Ext: -14deg RLE Strength RLE Overall Strength Comments: Pt able to demonstrate (+) SLR w/out significant lag, d/c KI LLE Assessment LLE Assessment: Exceptions to WFL LLE AROM (degrees) LLE Overall AROM Comments: Knee Flex: 90deg; Ext: -12deg LLE Strength LLE Overall Strength Comments: Pt able to demonstrate SLR w/ >10deg ext lag that increases w/ fatigue, continued use of KI  FIM:  FIM - Control and instrumentation engineer Devices: Walker;Bed rails;Arm rests Bed/Chair Transfer: 4: Supine > Sit: Min A (steadying Pt. > 75%/lift 1 leg);3: Sit > Supine: Mod A (lifting assist/Pt. 50-74%/lift 2 legs);4: Bed > Chair or W/C: Min A (steadying Pt. > 75%);4: Chair or W/C > Bed: Min A (steadying Pt. > 75%) FIM - Locomotion: Wheelchair Distance: 200'x2 Locomotion: Wheelchair: 5: Travels 150 ft or more: maneuvers on rugs and over door sills with supervision, cueing or coaxing FIM - Locomotion: Ambulation Locomotion: Ambulation Assistive  Devices: Administrator Ambulation/Gait Assistance: 4: Min assist Locomotion: Ambulation: 2: Travels 50 - 149 ft with minimal assistance (Pt.>75%)   Refer to Care Plan for Long Term Goals  Recommendations for other services: None  Discharge Criteria: Patient will be discharged from PT if patient refuses treatment 3 consecutive times without medical reason, if treatment goals not met, if there is a change in medical status, if patient makes no progress towards goals or if patient is discharged from hospital.  The above assessment, treatment plan, treatment alternatives and goals were discussed and mutually agreed upon: by patient  Gilmore Laroche 07/14/2013, 8:03 AM

## 2013-07-13 NOTE — Progress Notes (Signed)
Occupational Therapy Session Note  Patient Details  Name: Jorge Gray MRN: [id number] Date of Birth: 08/28/1945  Today's Date: 07/13/2013 Time: 6503-5465 Time Calculation (min): 45 min  Short Term Goals: Week 1:  OT Short Term Goal 1 (Week 1): STGs=LTGs  Skilled Therapeutic Interventions/Progress Updates:  Pt resting in bed upon arrival and agreeable to participating in therapy.  After Lt KI donned pt sat EOB using side rails and amb with RW to w/c requiring supervision only.  Pt rolled to ADL apartment to practice walk-in shower transfers stepping over 4" ledge to simulate home environment. Pt practiced bed mobility from 31" height surface requiring only supervision.  Patient issued walker bag and discussed/demonstrated RW safety.  Pt requested to return to bed with ice bags on bilateral knees at end of therapy session.  Therapy Documentation Precautions:  Precautions Precautions: Fall;Knee Precaution Comments: instructed pt on B KI use for amb and proper application ; WBAT bilaterally Required Braces or Orthoses: Knee Immobilizer - Left;Knee Immobilizer - Right Knee Immobilizer - Right: Discontinue once straight leg raise with < 10 degree lag Knee Immobilizer - Left: Discontinue once straight leg raise with < 10 degree lag Restrictions Weight Bearing Restrictions: No RLE Weight Bearing: Weight bearing as tolerated LLE Weight Bearing: Weight bearing as tolerated  See FIM for current functional status  Therapy/Group: Individual Therapy  Leroy Libman 07/13/2013, 2:23 PM

## 2013-07-13 NOTE — Progress Notes (Addendum)
ANTICOAGULATION CONSULT NOTE - Follow up  Pharmacy Consult for Warfarin Indication: VTE prophylaxis  No Known Allergies  Patient Measurements: Height: 6' (182.9 cm) Weight: 208 lb 11.2 oz (94.666 kg) IBW/kg (Calculated) : 77.6  Vital Signs: Temp: 98.2 F (36.8 C) (03/11 0604) Temp src: Oral (03/11 0604) BP: 152/72 mmHg (03/11 0606) Pulse Rate: 88 (03/11 0606)  Labs:  Recent Labs  07/11/13 0416 07/12/13 0427 07/13/13 0625  HGB 10.2* 10.0* 10.2*  HCT 31.6* 29.7* 29.9*  PLT 180 206 221  LABPROT 17.1* 15.4* 16.6*  INR 1.43 1.25 1.38  CREATININE 0.Jorge  --  0.71    Estimated Creatinine Clearance: 108.4 ml/min (by C-G formula based on Cr of 0.71).  Assessment: Jorge Gray s/p bilateral total knee arthroplasty 3/6 on coumadin for post-op for VTE prophylaxis. Per ortho plan: coumadin X 4 weeks followed by 81 mg ASA x 4 weeks.   Hgb 10.2, Plt 221, stable  No bleeding reported.  INR 13.8, trending up slightly  Noted also on Lovenox 30 mg SQ Q 12 hrs  Goal of Therapy:  INR 2-3 Monitor platelets by anticoagulation protocol: Yes   Plan:  1) Warfarin 7.5 mg tonight at 1800 2) Daily PT/INR while inpatient 3) Prophylactic-dose Lovenox until INR therapeutic on warfarin  Maryanna Shape, PharmD, BCPS  Clinical Pharmacist  Pager: [phone]    07/13/2013  8:25 AM

## 2013-07-13 NOTE — Progress Notes (Signed)
Patient information reviewed and entered into eRehab system by Tilford Deaton, RN, CRRN, PPS Coordinator.  Information including medical coding and functional independence measure will be reviewed and updated through discharge.     Per nursing patient was given "Data Collection Information Summary for Patients in Inpatient Rehabilitation Facilities with attached "Privacy Act Statement-Health Care Records" upon admission.  

## 2013-07-14 ENCOUNTER — Inpatient Hospital Stay (HOSPITAL_COMMUNITY): Payer: Managed Care, Other (non HMO)

## 2013-07-14 ENCOUNTER — Inpatient Hospital Stay (HOSPITAL_COMMUNITY): Payer: Managed Care, Other (non HMO) | Admitting: Occupational Therapy

## 2013-07-14 DIAGNOSIS — IMO0002 Reserved for concepts with insufficient information to code with codable children: Secondary | ICD-10-CM | POA: Diagnosis not present

## 2013-07-14 DIAGNOSIS — M1710 Unilateral primary osteoarthritis, unspecified knee: Secondary | ICD-10-CM | POA: Diagnosis not present

## 2013-07-14 DIAGNOSIS — Z96659 Presence of unspecified artificial knee joint: Secondary | ICD-10-CM | POA: Diagnosis not present

## 2013-07-14 DIAGNOSIS — D62 Acute posthemorrhagic anemia: Secondary | ICD-10-CM | POA: Diagnosis not present

## 2013-07-14 LAB — PROTIME-INR
INR: 1.74 — ABNORMAL HIGH (ref 0.00–1.49)
Prothrombin Time: 19.8 seconds — ABNORMAL HIGH (ref 11.6–15.2)

## 2013-07-14 MED ORDER — WARFARIN SODIUM 7.5 MG PO TABS
7.5000 mg | ORAL_TABLET | Freq: Once | ORAL | Status: AC
  Administered 2013-07-14: 7.5 mg via ORAL
  Filled 2013-07-14: qty 1

## 2013-07-14 MED ORDER — POTASSIUM CHLORIDE CRYS ER 20 MEQ PO TBCR
20.0000 meq | EXTENDED_RELEASE_TABLET | Freq: Every day | ORAL | Status: AC
  Administered 2013-07-14 – 2013-07-16 (×3): 20 meq via ORAL
  Filled 2013-07-14 (×2): qty 1

## 2013-07-14 NOTE — IPOC Note (Signed)
Overall Plan of Care [facility]) Patient Details Name: Jorge Gray MRN: [id number] DOB: 1945/09/01  Admitting Diagnosis: [facility] Problems: Active Problems:   DJD (degenerative joint disease) of knee     Functional Problem List: Nursing Bladder;Bowel;Endurance;Medication Management;Pain;Safety;Skin Integrity  PT Balance;Other (comment);Endurance;Pain;Safety;Skin Integrity;Edema (strength)  OT Balance;Edema;Endurance;Pain;Safety  SLP    TR         Basic ADL's: OT Grooming;Bathing;Dressing;Toileting     Advanced  ADL's: OT Light Housekeeping (Simple Snack Prep)     Transfers: PT Bed Mobility;Bed to Chair;Car;Furniture  OT Toilet;Tub/Shower     Locomotion: PT Stairs;Wheelchair Mobility;Ambulation     Additional Impairments: OT    SLP        TR      Anticipated Outcomes Item Anticipated Outcome  Self Feeding    Swallowing      Basic self-care  Mod I with AE PRN  Toileting  Mod I    Bathroom Transfers Mod I for toilet transfer, Supervision for shower transfer with DME  Bowel/Bladder  mod I with bowel and bladder  Transfers  Mod(I)  Locomotion  Mod(I) for locomotion in home environment, (S) for ambulation in community environment  Communication     Cognition     Pain  3 or less on scale 1-10  Safety/Judgment  supervision   Therapy Plan: PT Intensity: Minimum of 1-2 x/day ,45 to 90 minutes PT Frequency: 5 out of 7 days PT Duration Estimated Length of Stay: 7-9 days OT Intensity: Minimum of 1-2 x/day, 45 to 90 minutes OT Frequency: 5 out of 7 days OT Duration/Estimated Length of Stay: 7 days         Team Interventions: Nursing Interventions Patient/Family Education;Bladder Management;Bowel Management;Disease Management/Prevention;Pain Management;Discharge Planning;Skin Care/Wound Management;Medication Management;Psychosocial Support  PT interventions Ambulation/gait training;Balance/vestibular training;Discharge planning;Pain  management;Skin care/wound management;Therapeutic Activities;UE/LE Coordination activities;UE/LE Strength taining/ROM;Stair Location manager;Neuromuscular re-education;Wheelchair propulsion/positioning;Therapeutic Exercise;Patient/family education;Functional mobility training;Disease management/prevention  OT Interventions Balance/vestibular training;Community reintegration;DME/adaptive equipment instruction;Discharge planning;Functional mobility training;Patient/family education;Pain management;Self Care/advanced ADL retraining;Therapeutic Activities  SLP Interventions    TR Interventions    SW/CM Interventions Discharge Planning;Psychosocial Support;Patient/Family Education    Team Discharge Planning: Destination: PT-Home ,OT-   , SLP-  Projected Follow-up: PT-Outpatient PT, OT-   , SLP-  Projected Equipment Needs: PT-Rolling walker with 5" wheels;To be determined, OT-  , SLP-  Equipment Details: PT- , OT-  Patient/family involved in discharge planning: PT- Patient,  OT-Patient, SLP-   MD ELOS: 7 days Medical Rehab Prognosis:  Excellent Assessment: The patient has been admitted for CIR therapies. The team will be addressing, functional mobility, strength, stamina, balance, safety, adaptive techniques/equipment, self-care, bowel and bladder mgt, patient and caregiver education, pain mgt, Knee ROM, quad strength. Goals have been set at mod I for mobility and self-care.    Meredith Staggers, MD, FAAPMR      See Team Conference Notes for weekly updates to the plan of care

## 2013-07-14 NOTE — Progress Notes (Signed)
Occupational Therapy Session Note  Patient Details  Name: EAIN MULLENDORE MRN: [id number] Date of Birth: 16-Jul-1945  Today's Date: 07/14/2013 Time: 1000-1100 Time Calculation (min): 60 min  Short Term Goals: Week 1:  OT Short Term Goal 1 (Week 1): STGs=LTGs  Skilled Therapeutic Interventions/Progress Updates:  Patient resting in bed upon arrival.  Engaged in self care retraining to include shower, dress and groom.  Focused session on functional mobility and walker safety with RW, gather clothes prior to shower, transfer to low chair in bathroom to dress, ambulate to sink and stand for grooming tasks. Patient able to donn shoes without LH shoe horn secondary to thin TED hose instead on bulky socks.  Patient reports that bed is 34" high therefore practiced w/c>high bed, bed><w/c with RW and bed mobility.  Patient reports he feels high bed will be manageable.  Therapy Documentation Precautions:  Precautions Precautions: Fall;Knee Precaution Comments: instructed pt on L KI use for amb and proper application ; WBAT bilaterally Required Braces or Orthoses: Knee Immobilizer - Left Knee Immobilizer - Right: Discontinue once straight leg raise with < 10 degree lag Knee Immobilizer - Left: Discontinue once straight leg raise with < 10 degree lag Restrictions Weight Bearing Restrictions: No RLE Weight Bearing: Weight bearing as tolerated LLE Weight Bearing: Weight bearing as tolerated Pain: 3/10 in B knees before activity, 6/10 by end of session.  Rest, repositioned, RN notified. ADL: See FIM for current functional status  Therapy/Group: Individual Therapy  Nil Bolser 07/14/2013, 1:54 PM

## 2013-07-14 NOTE — Progress Notes (Signed)
Subjective/Complaints: Did well with therapies. Legs "sore" but tolerable. Used CPM an hour each leg last night A 12 point review of systems has been performed and if not noted above is otherwise negative.   Objective: Vital Signs: Blood pressure 132/67, pulse 76, temperature 98.1 F (36.7 C), temperature source Oral, resp. rate 18, height 6' (1.829 m), weight 94.666 kg (208 lb 11.2 oz), SpO2 98.00%. No results found.  Recent Labs  07/12/13 0427 07/13/13 0625  WBC 10.8* 8.2  HGB 10.0* 10.2*  HCT 29.7* 29.9*  PLT 206 221    Recent Labs  07/13/13 0625  NA 137  K 3.6*  CL 99  GLUCOSE 115*  BUN 13  CREATININE 0.71  CALCIUM 8.5   CBG (last 3)  No results found for this basename: GLUCAP,  in the last 72 hours  Wt Readings from Last 3 Encounters:  07/13/13 94.666 kg (208 lb 11.2 oz)  07/08/13 89.812 kg (198 lb)  07/08/13 89.812 kg (198 lb)    Physical Exam:  Constitutional: He is oriented to person, place, and time. He appears well-developed and well-nourished.  HENT:  Head: Normocephalic and atraumatic.  Right Ear: External ear normal.  Left Ear: External ear normal.  Mouth/Throat: Oropharynx is clear and moist.  Eyes: Conjunctivae and EOM are normal. Pupils are equal, round, and reactive to light.  Cardiovascular: Normal rate, regular rhythm and normal heart sounds.  Respiratory: Effort normal and breath sounds normal. No wheezes, rales, or rhonchi  GI: Soft. Bowel sounds are normal.  Neurological: He is alert and oriented to person, place, and time. No sensory deficit.  Motor strength is 5/5 in bilateral deltoid, bicep, tricep, grip 1+ to 2-/5 bilateral hip flexors and knee extensors 4/5 left ankle dorsiflexor plantar flexor 3+/5 right ankle dorsiflexor plantar flexor  Skin: Skin is warm and dry. There is erythema.  Edema around bilateral knee incision which is improving. Minimal to no drainage. Psychiatric: He has a normal mood and affect.  Knee  ROM: almost 90 deg bilaterally with PROM     Assessment/Plan: 1. Functional deficits secondary to bilateral OA of knees s/p TKA's which require 3+ hours per day of interdisciplinary therapy in a comprehensive inpatient rehab setting. Physiatrist is providing close team supervision and 24 hour management of active medical problems listed below. Physiatrist and rehab team continue to assess barriers to discharge/monitor patient progress toward functional and medical goals. FIM: FIM - Bathing Bathing Steps Patient Completed: Chest;Right Arm;Left Arm;Abdomen;Front perineal area;Right upper leg;Left upper leg Bathing: 3: Mod-Patient completes 5-7 71f 10 parts or 50-74%  FIM - Upper Body Dressing/Undressing Upper body dressing/undressing steps patient completed: Thread/unthread right sleeve of pullover shirt/dresss;Thread/unthread left sleeve of pullover shirt/dress;Put head through opening of pull over shirt/dress;Pull shirt over trunk Upper body dressing/undressing: 5: Set-up assist to: Obtain clothing/put away FIM - Lower Body Dressing/Undressing Lower body dressing/undressing steps patient completed: Thread/unthread right pants leg;Thread/unthread left pants leg;Pull pants up/down;Don/Doff right sock;Don/Doff left sock;Fasten/unfasten right shoe;Fasten/unfasten left shoe Lower body dressing/undressing: 4: Min-Patient completed 75 plus % of tasks  FIM - Toileting Toileting steps completed by patient: Adjust clothing prior to toileting;Performs perineal hygiene;Adjust clothing after toileting Toileting Assistive Devices: Grab bar or rail for support Toileting: 4: Steadying assist  FIM - Radio producer Devices: Walker;Grab bars;Elevated toilet seat Toilet Transfers: 4-To toilet/BSC: Min A (steadying Pt. > 75%);4-From toilet/BSC: Min A (steadying Pt. > 75%)  FIM - Control and instrumentation engineer Devices:  Walker;Bed rails;Arm rests Bed/Chair  Transfer: 4: Supine > Sit: Min A (steadying Pt. > 75%/lift 1 leg);3: Sit > Supine: Mod A (lifting assist/Pt. 50-74%/lift 2 legs);4: Bed > Chair or W/C: Min A (steadying Pt. > 75%);4: Chair or W/C > Bed: Min A (steadying Pt. > 75%)  FIM - Locomotion: Wheelchair Distance: 200'x2 Locomotion: Wheelchair: 5: Travels 150 ft or more: maneuvers on rugs and over door sills with supervision, cueing or coaxing FIM - Locomotion: Ambulation Locomotion: Ambulation Assistive Devices: Administrator Ambulation/Gait Assistance: 4: Min assist Locomotion: Ambulation: 2: Travels 50 - 149 ft with minimal assistance (Pt.>75%)  Comprehension Comprehension Mode: Auditory Comprehension: 7-Follows complex conversation/direction: With no assist  Expression Expression Mode: Verbal Expression: 7-Expresses complex ideas: With no assist  Social Interaction Social Interaction: 7-Interacts appropriately with others - No medications needed.  Problem Solving Problem Solving: 5-Solves complex 90% of the time/cues < 10% of the time  Memory Memory: 7-Complete Independence: No helper  Medical Problem List and Plan:  1. DVT Prophylaxis/Anticoagulation: Pharmaceutical: Coumadin--INR till coumadin therapeutic.  2. Pain Management: oxycodone for breakthrough pain, will use 5-15mg  initially q3 prn.   -still working on a "happy medium" 3. Mood: LCSW to follow for evaluation and support.  4. Neuropsych: This patient is capable of making decisions on his own behalf.  5. ABLA: Added iron supplement. hgb 10.2   6. Orthostasis: Push po fluids. Holding BP medications for now. No orthostasis this morning  7. Mild hypokalemia: supplement    LOS (Days) 2 A FACE TO FACE EVALUATION WAS PERFORMED  SWARTZ,ZACHARY T 07/14/2013 8:19 AM

## 2013-07-14 NOTE — Progress Notes (Signed)
Pt placed on CPM LLE 0-90 by ortho tech at 2025, tolerated well. At 2135, pt called to switch CPM to RLE. At 2230, pt called and stated he was ready to come off of the CPM. Encouraged pt to stay on machine a little longer.Pt asked RN "is this something I'm going to have to do every night?" Discussed with pt the importance of using the CPM for several hours (specific amount of time is not ordered) on each leg every day in order to establish good ROM in his knees. Pt verbalized understanding. No complaints of pain upon removing CPM, ice packs applied to bil knees per pts request. Continue to monitor. Hortencia Conradi RN

## 2013-07-14 NOTE — Progress Notes (Signed)
ANTICOAGULATION CONSULT NOTE - Follow up  Pharmacy Consult for Warfarin Indication: VTE prophylaxis  No Known Allergies  Patient Measurements: Height: 6' (182.9 cm) Weight: 208 lb 11.2 oz (94.666 kg) IBW/kg (Calculated) : 77.6  Vital Signs: Temp: 98.1 F (36.7 C) (03/12 0605) Temp src: Oral (03/12 0605) BP: 132/67 mmHg (03/12 0606) Pulse Rate: 76 (03/12 0606)  Labs:  Recent Labs  07/12/13 0427 07/13/13 0625 07/14/13 0645  HGB 10.0* 10.2*  --   HCT 29.7* 29.9*  --   PLT 206 221  --   LABPROT 15.4* 16.6* 19.8*  INR 1.25 1.38 1.74*  CREATININE  --  0.71  --     Estimated Creatinine Clearance: 108.4 ml/min (by C-G formula based on Cr of 0.71).  Assessment: 49 yoM s/p bilateral total knee arthroplasty 3/6 on coumadin for post-op for VTE prophylaxis. Per ortho plan: coumadin x 4 weeks followed by 81 mg ASA x 4 weeks. INR 1.74, trending up. No new cbc, noted also on Lovenox 30 mg SQ Q 12 hrs.  Goal of Therapy:  INR 2-3 Monitor platelets by anticoagulation protocol: Yes   Plan:  1) Warfarin 7.5 mg tonight at 1800 2) Daily PT/INR while inpatient 3) Prophylactic-dose Lovenox until INR therapeutic on warfarin  Maryanna Shape, PharmD, BCPS  Clinical Pharmacist  Pager: [phone]  07/14/2013  12:18 PM

## 2013-07-14 NOTE — Progress Notes (Signed)
Occupational Therapy Session Note  Patient Details  Name: Jorge Gray MRN: [id number] Date of Birth: 1945/05/13  Today's Date: 07/14/2013 Time: 1300-1400 Time Calculation (min): 60 min   Skilled Therapeutic Interventions/Progress Updates:    Pt seen during a UE Exercise group. He worked on a variety of exercises to increase arm strength and overall activity tolerance including arm pushups from the wheelchair, weighted dowel exercises, alternating knee raises to increase core strength, simulated boxing exercisesand Pilates based active/isometric exercises. Pt is quite strong with good activity tolerance, so he was given a heavier resistance to work with.  Pt was instructed in several exercises he could do at home without equipment. Pt tolerated all the exercises well with minimal rest breaks. Pt returned to his room at the end of his session with call light in reach.    Therapy Documentation Precautions:  Precautions Precautions: Fall;Knee Precaution Comments: instructed pt on L KI use for amb and proper application ; WBAT bilaterally Required Braces or Orthoses: Knee Immobilizer - Left Knee Immobilizer - Right: Discontinue once straight leg raise with < 10 degree lag Knee Immobilizer - Left: Discontinue once straight leg raise with < 10 degree lag Restrictions Weight Bearing Restrictions: No RLE Weight Bearing: Weight bearing as tolerated LLE Weight Bearing: Weight bearing as tolerated    Pain: No c/o pain during the group therapy session.  ADL:   See FIM for current functional status  Therapy/Group: Group Therapy  [name] 07/14/2013, 3:10 PM

## 2013-07-14 NOTE — Progress Notes (Signed)
Physical Therapy Session Note  Patient Details  Name: Jorge Gray MRN: [id number] Date of Birth: 07/10/1945  Today's Date: 07/14/2013 Time: 1100-1200 Time Calculation (min): 60 min  Short Term Goals: Week 1:  PT Short Term Goal 1 (Week 1): STGs=LTGs due to anticipated LOS  Skilled Therapeutic Interventions/Progress Updates:  1:1. Pt received semi-reclined in bed, ready for therapy. Pt able to demonstrate (+) B SLR w/ minimal lag, d/c KIs. Focus this session on functional endurance, B LE strength/ROM, ambulation and stair negotiation. Pt w/ good tolerance to supine therex, exercises included 2x10 reps of: quad sets, heel slides (assisted), glute sets, SAQ and SLR; seated therex included: knee flexion 2x10 reps w/ 5sec hold. Emphasis on quality of ambulation and stair negotiation this session.  Cueing for upright posture (increased weight on B LE vs. UE, limited knee extension), management of RW and foot placement on steps. Pt req overall close (S) this session for bed mobility, SPT, w/c propulsion 175', ambulation 100' and 200' as well as up/down 4 steps x2 (1x 4" and 2x 6"). Pt semi-reclined in bed at end of session w/ ice on B knees, all needs in reach and bed alarm on.   Therapy Documentation Precautions:  Precautions Precautions: Fall;Knee Precaution Comments: instructed pt on L KI use for amb and proper application ; WBAT bilaterally Required Braces or Orthoses: Knee Immobilizer - Left Knee Immobilizer - Right: Discontinue once straight leg raise with < 10 degree lag Knee Immobilizer - Left: Discontinue once straight leg raise with < 10 degree lag Restrictions Weight Bearing Restrictions: No RLE Weight Bearing: Weight bearing as tolerated LLE Weight Bearing: Weight bearing as tolerated Pain: Pain Assessment Pain Assessment: 0-10 Pain Score: 3  Pain Type: Surgical pain Pain Location: Knee Pain Orientation: Right;Left Pain Descriptors / Indicators: Aching Pain Frequency:  Intermittent Pain Onset: With Activity Patients Stated Pain Goal: 2 Pain Intervention(s): Medication (See eMAR);Repositioned;Emotional support Multiple Pain Sites: No  See FIM for current functional status  Therapy/Group: Individual Therapy  Gilmore Laroche 07/14/2013, 12:26 PM

## 2013-07-15 ENCOUNTER — Inpatient Hospital Stay (HOSPITAL_COMMUNITY): Payer: Managed Care, Other (non HMO)

## 2013-07-15 ENCOUNTER — Encounter (HOSPITAL_COMMUNITY): Payer: Managed Care, Other (non HMO)

## 2013-07-15 DIAGNOSIS — IMO0002 Reserved for concepts with insufficient information to code with codable children: Secondary | ICD-10-CM

## 2013-07-15 DIAGNOSIS — Z96659 Presence of unspecified artificial knee joint: Secondary | ICD-10-CM

## 2013-07-15 DIAGNOSIS — I10 Essential (primary) hypertension: Secondary | ICD-10-CM

## 2013-07-15 DIAGNOSIS — M1710 Unilateral primary osteoarthritis, unspecified knee: Secondary | ICD-10-CM | POA: Diagnosis not present

## 2013-07-15 DIAGNOSIS — D62 Acute posthemorrhagic anemia: Secondary | ICD-10-CM

## 2013-07-15 LAB — PROTIME-INR
INR: 2.03 — ABNORMAL HIGH (ref 0.00–1.49)
PROTHROMBIN TIME: 22.3 s — AB (ref 11.6–15.2)

## 2013-07-15 MED ORDER — WARFARIN SODIUM 7.5 MG PO TABS
7.5000 mg | ORAL_TABLET | Freq: Once | ORAL | Status: AC
  Administered 2013-07-15: 7.5 mg via ORAL
  Filled 2013-07-15: qty 1

## 2013-07-15 NOTE — Progress Notes (Signed)
Subjective/Complaints: Did well with therapies. Legs "sore" but tolerable. Used CPM an hour each leg last night A 12 point review of systems has been performed and if not noted above is otherwise negative.   Objective: Vital Signs: Blood pressure 142/87, pulse 85, temperature 98 F (36.7 C), temperature source Oral, resp. rate 16, height 6' (1.829 m), weight 94.666 kg (208 lb 11.2 oz), SpO2 98.00%. No results found.  Recent Labs  07/13/13 0625  WBC 8.2  HGB 10.2*  HCT 29.9*  PLT 221    Recent Labs  07/13/13 0625  NA 137  K 3.6*  CL 99  GLUCOSE 115*  BUN 13  CREATININE 0.71  CALCIUM 8.5   CBG (last 3)  No results found for this basename: GLUCAP,  in the last 72 hours  Wt Readings from Last 3 Encounters:  07/13/13 94.666 kg (208 lb 11.2 oz)  07/08/13 89.812 kg (198 lb)  07/08/13 89.812 kg (198 lb)    Physical Exam:  Constitutional: He is oriented to person, place, and time. He appears well-developed and well-nourished.  HENT:  Head: Normocephalic and atraumatic.  Right Ear: External ear normal.  Left Ear: External ear normal.  Mouth/Throat: Oropharynx is clear and moist.  Eyes: Conjunctivae and EOM are normal. Pupils are equal, round, and reactive to light.  Cardiovascular: Normal rate, regular rhythm and normal heart sounds.  Respiratory: Effort normal and breath sounds normal. No wheezes, rales, or rhonchi  GI: Soft. Bowel sounds are normal.  Neurological: He is alert and oriented to person, place, and time. No sensory deficit.  Motor strength is 5/5 in bilateral deltoid, bicep, tricep, grip 1+ to 2-/5 bilateral hip flexors and knee extensors 4/5 left ankle dorsiflexor plantar flexor 3+/5 right ankle dorsiflexor plantar flexor  Skin: Skin is warm and dry. There is erythema.  Edema around bilateral knee incision which is improving. Minimal to no drainage. Psychiatric: He has a normal mood and affect.  Knee ROM: almost 90 deg bilaterally with  PROM     Assessment/Plan: 1. Functional deficits secondary to bilateral OA of knees s/p TKA's which require 3+ hours per day of interdisciplinary therapy in a comprehensive inpatient rehab setting. Physiatrist is providing close team supervision and 24 hour management of active medical problems listed below. Physiatrist and rehab team continue to assess barriers to discharge/monitor patient progress toward functional and medical goals. FIM: FIM - Bathing Bathing Steps Patient Completed: Chest;Right Arm;Left Arm;Abdomen;Front perineal area;Right upper leg;Left upper leg;Buttocks;Right lower leg (including foot);Left lower leg (including foot) (LH sponge) Bathing: 5: Supervision: Safety issues/verbal cues  FIM - Upper Body Dressing/Undressing Upper body dressing/undressing steps patient completed: Thread/unthread right sleeve of pullover shirt/dresss;Thread/unthread left sleeve of pullover shirt/dress;Put head through opening of pull over shirt/dress;Pull shirt over trunk Upper body dressing/undressing: 5: Set-up assist to: Obtain clothing/put away FIM - Lower Body Dressing/Undressing Lower body dressing/undressing steps patient completed: Thread/unthread right pants leg;Thread/unthread left pants leg;Pull pants up/down;Fasten/unfasten right shoe;Fasten/unfasten left shoe;Don/Doff right shoe;Don/Doff left shoe Lower body dressing/undressing: 5: Set-up assist to: Don/Doff TED stocking  FIM - Toileting Toileting steps completed by patient: Adjust clothing prior to toileting;Performs perineal hygiene;Adjust clothing after toileting Toileting Assistive Devices: Grab bar or rail for support Toileting: 4: Steadying assist  FIM - Radio producer Devices: Walker;Grab bars;Elevated toilet seat Toilet Transfers: 4-To toilet/BSC: Min A (steadying Pt. > 75%);4-From toilet/BSC: Min A (steadying Pt. > 75%)  FIM - Control and instrumentation engineer Devices:  Walker;Arm rests Bed/Chair Transfer: 5: Chair or W/C > Bed: Supervision (verbal cues/safety issues);5: Bed > Chair or W/C: Supervision (verbal cues/safety issues);5: Supine > Sit: Supervision (verbal cues/safety issues);5: Sit > Supine: Supervision (verbal cues/safety issues)  FIM - Locomotion: Wheelchair Distance: 200'x2 Locomotion: Wheelchair: 5: Travels 150 ft or more: maneuvers on rugs and over door sills with supervision, cueing or coaxing FIM - Locomotion: Ambulation Locomotion: Ambulation Assistive Devices: Administrator Ambulation/Gait Assistance: 5: Supervision Locomotion: Ambulation: 2: Travels 50 - 149 ft with supervision/safety issues  Comprehension Comprehension Mode: Auditory Comprehension: 7-Follows complex conversation/direction: With no assist  Expression Expression Mode: Verbal Expression: 7-Expresses complex ideas: With no assist  Social Interaction Social Interaction: 7-Interacts appropriately with others - No medications needed.  Problem Solving Problem Solving: 7-Solves complex problems: Recognizes & self-corrects  Memory Memory: 7-Complete Independence: No helper  Medical Problem List and Plan:  1. DVT Prophylaxis/Anticoagulation: Pharmaceutical: Coumadin--INR till coumadin therapeutic.  2. Pain Management: oxycodone for breakthrough pain, 5-15mg  prn   -improving control 3. Mood: LCSW to follow for evaluation and support.  4. Neuropsych: This patient is capable of making decisions on his own behalf.  5. ABLA: Added iron supplement. hgb 10.2---recheck monday   6. Orthostasis: Push po fluids. Holding BP medications for now. orthostasis better  -may need to resume prinizide soon (or at least part of combo) 7. Mild hypokalemia: supplemented    LOS (Days) 3 A FACE TO FACE EVALUATION WAS PERFORMED  SWARTZ,ZACHARY T 07/15/2013 7:45 AM

## 2013-07-15 NOTE — Progress Notes (Signed)
ANTICOAGULATION CONSULT NOTE - Follow up  Pharmacy Consult for Warfarin Indication: VTE prophylaxis  No Known Allergies  Patient Measurements: Height: 6' (182.9 cm) Weight: 208 lb 11.2 oz (94.666 kg) IBW/kg (Calculated) : 77.6  Vital Signs: Temp: 98 F (36.7 C) (03/13 0534) Temp src: Oral (03/13 0534) BP: 142/87 mmHg (03/13 0534) Pulse Rate: 85 (03/13 0534)  Labs:  Recent Labs  07/13/13 0625 07/14/13 0645 07/15/13 0548  HGB 10.2*  --   --   HCT 29.9*  --   --   PLT 221  --   --   LABPROT 16.6* 19.8* 22.3*  INR 1.38 1.74* 2.03*  CREATININE 0.71  --   --     Estimated Creatinine Clearance: 108.4 ml/min (by C-G formula based on Cr of 0.71).  Assessment: 49 yoM s/p bilateral total knee arthroplasty 3/6 on coumadin for post-op for VTE prophylaxis. Per ortho plan: coumadin x 4 weeks followed by 81 mg ASA x 4 weeks. INR 2.03, trending up. No new cbc, noted also on Lovenox 30 mg SQ Q 12 hrs.  Goal of Therapy:  INR 2-3 Monitor platelets by anticoagulation protocol: Yes   Plan:  1) Warfarin 7.5 mg tonight at 1800 2) Daily PT/INR while inpatient 3) D/C lovenox  Maryanna Shape, PharmD, BCPS  Clinical Pharmacist  Pager: [phone]  07/15/2013  8:43 AM

## 2013-07-15 NOTE — Progress Notes (Signed)
Social Work Patient ID: Jorge Gray, male   DOB: 1945-11-27, 68 y.o.   MRN: [id number] Team feels pt is ready for discharge Monday after therapy.  MD is agreeable to this.  Pt wants to check around which OP is better. Will let worker know later today.  Will order rolling walker and await pt's preference.

## 2013-07-15 NOTE — Progress Notes (Signed)
Physical Therapy Session Note  Patient Details  Name: Jorge Gray MRN: [id number] Date of Birth: April 02, 1946  Today's Date: 07/15/2013 Time: Treatment Session 1: 0930-1030; Treatment Session 2: 1420-1500 Time Calculation (min): Treatment Session 1: 60 min; Treatment Session 2: 79min  Short Term Goals: Week 1:  PT Short Term Goal 1 (Week 1): STGs=LTGs due to anticipated LOS  Skilled Therapeutic Interventions/Progress Updates:  Treatment Session 1:  1:1. Pt received supine in bed, ready for therapy. Pt w/ questions regarding positioning of CPM machine, education regarding placement of axis. Noted leg length too short for axis to be in appropriate position directly beneath knee, adjusted for improved fit. Pt reports improved comfort and bend noted in knee w/ adjustments. Pt req mod cueing for car transfer w/ light min A. Pt noted increased soreness during ambulation x150', 100' and 175' w/ RW, req standing rest breaks throughout w/ close(S)-min guard A. Cues for increased B knee ext in standing due to flexed posture. Use of NuStep at end of session to initially target B knee ROM w/ 5sec hold, transition to target strength/endurance, level 3x82min. Pt supine in bed at end of session w/ all needs in reach, bed alarm on.   Treatment Session 2:  1:1. Pt received semi-reclined in bed, ready for therapy. Pt req supervision for toileting at start of session. Pt requesting seated rest after initial 13' of amb room> therapy gym due to 9/10 pain in R knee, reports receiving medicine prior to start of session. Fair tolerance to remaining ambulation this session x100' and 175'. Use of NuStep to target B knee ROM w/ 5sec hold, progressing to strength/endurance, level 3x63min w/ good tolerance. ROM reassessed: R knee flexion: 100deg, ext: -10deg ; L knee flex: 100deg, ext:-8deg . Pt semi-reclined in bed at end of session w/ all needs in reach, CPM in place on L LE and bed alarm on.   Therapy  Documentation Precautions:  Precautions Precautions: Fall;Knee Precaution Comments: instructed pt on L KI use for amb and proper application ; WBAT bilaterally Required Braces or Orthoses: Knee Immobilizer - Left Knee Immobilizer - Right: Discontinue once straight leg raise with < 10 degree lag Knee Immobilizer - Left: Discontinue once straight leg raise with < 10 degree lag Restrictions Weight Bearing Restrictions: Yes RLE Weight Bearing: Weight bearing as tolerated LLE Weight Bearing: Weight bearing as tolerated Pain: Pain Assessment Pain Assessment: 0-10 Pain Score: 3  Pain Type: Surgical pain Pain Location: Knee Pain Orientation: Left;Right Pain Descriptors / Indicators: Aching;Sore Pain Frequency: Intermittent Pain Onset: With Activity Patients Stated Pain Goal: 3 Pain Intervention(s): Distraction  See FIM for current functional status  Therapy/Group: Individual Therapy  Gilmore Laroche 07/15/2013, 10:30 AM

## 2013-07-15 NOTE — Progress Notes (Signed)
Occupational Therapy Session Note  Patient Details  Name: Jorge Gray MRN: [id number] Date of Birth: March 25, 1946  Today's Date: 07/15/2013  Session 1 Time: 0800-0855 Time Calculation (min): 55 min  Short Term Goals: Week 1:  OT Short Term Goal 1 (Week 1): STGs=LTGs  Skilled Therapeutic Interventions/Progress Updates:    Pt engaged in bathing at shower level with dressing sit<>stand from lower surface chair.  Pt gathered clothing and bathing supplies at RW level prior to amb with RW into bathroom to complete B/D tasks.  Pt completed grooming tasks while standing at sink.  Pt required assistance with donning ted hose.  Pt completed all other tasks at supervision level with no safety issues.  Focus on safety awareness, activity tolerance, dynamic standing balance, functional ambulation for home mgmt tasks, and sit<>stand from variety of surface heights.  Therapy Documentation Precautions:  Precautions Precautions: Fall;Knee Precaution Comments: instructed pt on L KI use for amb and proper application ; WBAT bilaterally Required Braces or Orthoses: Knee Immobilizer - Left Knee Immobilizer - Right: Discontinue once straight leg raise with < 10 degree lag Knee Immobilizer - Left: Discontinue once straight leg raise with < 10 degree lag Restrictions Weight Bearing Restrictions: No RLE Weight Bearing: Weight bearing as tolerated LLE Weight Bearing: Weight bearing as tolerated Pain: Pain Assessment Pain Assessment: 0-10 Pain Score: 6  Pain Type: Surgical pain Pain Location: Knee Pain Orientation: Right;Left Pain Descriptors / Indicators: Aching;Sore Pain Frequency: Intermittent Pain Onset: With Activity Pain Intervention(s): RN made aware;Repositioned  See FIM for current functional status  Therapy/Group: Individual Therapy  Session 2 1100-1130 Pt c/o 5/10 pain in bilateral knees; RN aware, ice applied and repositioned Individual therapy  Pt amb with RW to ADL apartment  to engage in simple meal prep tasks and home mgmt tasks.  Discussed kitchen layout at home and RW safety during kitchen/cooking tasks.  Pt demonstrated simple meal prep task at mod I level once strategies and safety techniques explained.  Pt engaged in simple home mgmt tasks in bedroom of apartment including making up bed, transporting clothing items, and retrieving items from floor.  Recommended patient purchase a reacher after discharge.   Leotis Shames [facility] 07/15/2013, 9:02 AM

## 2013-07-15 NOTE — Progress Notes (Signed)
Social Work Patient ID: Jorge Gray, male   DOB: 21-Mar-1946, 68 y.o.   MRN: [id number] Met with pt who prefers to use [facility] in [place].  Have contacted and made referral, first appointment 3/17 at 11;30. Pt is comfortable with discharge on Monday.

## 2013-07-16 ENCOUNTER — Inpatient Hospital Stay (HOSPITAL_COMMUNITY): Payer: Managed Care, Other (non HMO)

## 2013-07-16 ENCOUNTER — Inpatient Hospital Stay (HOSPITAL_COMMUNITY): Payer: Managed Care, Other (non HMO) | Admitting: Occupational Therapy

## 2013-07-16 DIAGNOSIS — D62 Acute posthemorrhagic anemia: Secondary | ICD-10-CM

## 2013-07-16 DIAGNOSIS — IMO0002 Reserved for concepts with insufficient information to code with codable children: Secondary | ICD-10-CM

## 2013-07-16 DIAGNOSIS — I10 Essential (primary) hypertension: Secondary | ICD-10-CM

## 2013-07-16 DIAGNOSIS — E7800 Pure hypercholesterolemia, unspecified: Secondary | ICD-10-CM | POA: Diagnosis not present

## 2013-07-16 DIAGNOSIS — M1710 Unilateral primary osteoarthritis, unspecified knee: Secondary | ICD-10-CM | POA: Diagnosis not present

## 2013-07-16 LAB — PROTIME-INR
INR: 2.77 — ABNORMAL HIGH (ref 0.00–1.49)
PROTHROMBIN TIME: 28.3 s — AB (ref 11.6–15.2)

## 2013-07-16 MED ORDER — WARFARIN SODIUM 2.5 MG PO TABS
2.5000 mg | ORAL_TABLET | Freq: Once | ORAL | Status: AC
  Administered 2013-07-16: 2.5 mg via ORAL
  Filled 2013-07-16: qty 1

## 2013-07-16 NOTE — Progress Notes (Addendum)
Physical Therapy Session Note  Patient Details  Name: Jorge Gray MRN: [id number] Date of Birth: 05/08/1945  Today's Date: 07/16/2013 Time: 0900-1000 and 1330- 1415 Time Calculation (min): 60 min and 45 min  Short Term Goals: Week 1:  PT Short Term Goal 1 (Week 1): STGs=LTGs due to anticipated LOS  Skilled Therapeutic Interventions/Progress Updates:   Treatment 1: Pt received in bed and agreeable to PT therapy. Pt received new RW for home use and request PT adjust for use during therapy session. Pt ambulated in hallway 100 feet at supervision level before requiring rest break due to increased pain bilateral knees. Pt propelled wheel chair in hallways at mod I/supervision on level surfaces using BUE. Pt request use of nu-step as pt reports nu-step seems to loosen up knees for increased mobility. Pt completed nu-step on level 3, increasing seat position as time duration increased with emphasis on knee flexion. Discussed pt's home entrance and set-up inside the home. Pt negotiated up/down 4 steps using a L railing at supervision level to facilitate home set-up to enter/exit patients kitchen. Also facilitated set-up of 34 inch high bed with mat table raised to 34 inches. Pt able to demonstrate safe technique with and without use of step stool. Pt ambulated in hallways 100 feet x2 reps with RW at supervision level. Pt fatigues easily and also limited by increasing pain in bilateral knees despite receiving pain medication.  Pt returned to room seated in w/c with all needs in reach.   Treatment 2: Pt received sleeping supine in bed, but easily aroused/awakened. Pt request to use the bathroom prior to going to the gym. Pt able to don shoes independently sitting EOB. Pt ambulated to bathroom and able to stand to urinate at close supervision level, instead of sitting down. Pt braced himself holding onto wall for added stability/balance. Pt propelled w/c to gym at mod I on level surfaces using BUE. Pt  completed there ex in supine and sitting. There ex included quad sets, SAQ, hip abd, HS, SLR and LAQ, 2x10. Pt instructed in seated self knee flexion stretch and also to placed pillow under lower calf to allow gravity to assist in increasing knee extension while in supine. Pt remained in gym for OT session.   Therapy Documentation Precautions:  Precautions Precautions: Fall;Knee Precaution Comments: instructed pt on L KI use for amb and proper application ; WBAT bilaterally Required Braces or Orthoses: Knee Immobilizer - Left Knee Immobilizer - Right: Discontinue once straight leg raise with < 10 degree lag Knee Immobilizer - Left: Discontinue once straight leg raise with < 10 degree lag Restrictions Weight Bearing Restrictions: Yes RLE Weight Bearing: Weight bearing as tolerated LLE Weight Bearing: Weight bearing as tolerated    Pain: Pain Assessment Pain Assessment: 0-10 Pain Score: 4  Pain Type: Acute pain Pain Location: Knee Pain Orientation: Right;Left Pain Descriptors / Indicators: Aching;Operative site guarding Pain Onset: Awakened from sleep Patients Stated Pain Goal: 1 Pain Intervention(s): Repositioned;Emotional support (Pt received pain medication prior to treatment) Multiple Pain Sites: No   See FIM for current functional status  Therapy/Group: Individual Therapy  Renee Beale R 07/16/2013, 10:03 AM

## 2013-07-16 NOTE — Progress Notes (Signed)
Jorge Gray is a 68 y.o. male 06/10/1945 [id number]  Subjective: No new complaints. No new problems. Slept well. Feeling OK.  Objective: Vital signs in last 24 hours: Temp:  [98.3 F (36.8 C)] 98.3 F (36.8 C) (03/14 0550) Pulse Rate:  [79-96] 96 (03/14 0556) Resp:  [17-20] 20 (03/14 0556) BP: (126-160)/(70-78) 126/70 mmHg (03/14 0556) SpO2:  [96 %-99 %] 99 % (03/14 0556) Weight change:  Last BM Date: 07/14/13  Intake/Output from previous day: 03/13 0701 - 03/14 0700 In: 720 [P.O.:720] Out: 650 [Urine:650] Last cbgs: CBG (last 3)  No results found for this basename: GLUCAP,  in the last 72 hours   Physical Exam General: No apparent distress   HEENT: not dry Lungs: Normal effort. Lungs clear to auscultation, no crackles or wheezes. Cardiovascular: Regular rate and rhythm, no edema Abdomen: S/NT/ND; BS(+) Musculoskeletal:  unchanged Neurological: No new neurological deficits Wounds: B knees - clean wounds   Skin: clear  Aging changes Mental state: Alert, oriented, cooperative    Lab Results: BMET    Component Value Date/Time   NA 137 07/13/2013 0625   K 3.6* 07/13/2013 0625   CL 99 07/13/2013 0625   CO2 26 07/13/2013 0625   GLUCOSE 115* 07/13/2013 0625   BUN 13 07/13/2013 0625   CREATININE 0.71 07/13/2013 0625   CALCIUM 8.5 07/13/2013 0625   GFRNONAA >90 07/13/2013 0625   GFRAA >90 07/13/2013 0625   CBC    Component Value Date/Time   WBC 8.2 07/13/2013 0625   RBC 3.72* 07/13/2013 0625   HGB 10.2* 07/13/2013 0625   HCT 29.9* 07/13/2013 0625   PLT 221 07/13/2013 0625   MCV 80.4 07/13/2013 0625   MCH 27.4 07/13/2013 0625   MCHC 34.1 07/13/2013 0625   RDW 13.6 07/13/2013 0625   LYMPHSABS 1.5 07/13/2013 0625   MONOABS 1.1* 07/13/2013 0625   EOSABS 0.3 07/13/2013 0625   BASOSABS 0.0 07/13/2013 0625    Studies/Results: No results found.  Medications: I have reviewed the patient's current medications.  Assessment/Plan:  1. DVT Prophylaxis/Anticoagulation:  Pharmaceutical: Coumadin--INR till coumadin therapeutic.  2. Pain Management: oxycodone for breakthrough pain, 5-15mg  prn  -improving control  3. Mood: LCSW to follow for evaluation and support.  4. Neuropsych: This patient is capable of making decisions on his own behalf.  5. ABLA: Added iron supplement. hgb 10.2---recheck monday  6. Orthostasis: Push po fluids. Holding BP medications for now. orthostasis better  -may need to resume prinizide soon (or at least part of combo)  7. Mild hypokalemia: supplemented  Cont Rx     Length of stay, days: 4  Walker Kehr , MD 07/16/2013, 2:34 PM

## 2013-07-16 NOTE — Progress Notes (Signed)
Occupational Therapy Session Note  Patient Details  Name: Jorge Gray MRN: [id number] Date of Birth: [birth year]  Today's Date: 07/16/2013 Time: 0930-1010 Time Calculation (min): 40 min  Short Term Goals: Week 1:  OT Short Term Goal 1 (Week 1): STGs=LTGs  Skilled Therapeutic Interventions/Progress Updates:  Patient resting in w/c upon arrival.  Engaged in self care retraining to include shower and dressing.  Focused session on activity tolerance, walker safety, sit><stand standing tolerance, safe shower, w/c and bed transfers.  Patient reports that he completed grooming tasks while seated at sink before ins AM PT session.  Patient reports feeling really tired today and requested back to bed with ice on knees at end of session.  Overall supervision-Mod I for above tasks.  Therapy Documentation Precautions:  Precautions Precautions: Fall;Knee RLE Weight Bearing: Weight bearing as tolerated LLE Weight Bearing: Weight bearing as tolerated Pain: 4/10 B knees, RN notified and medication provided.  Provided rest, repositioning and ice ADL:  See FIM for current functional status  Therapy/Group: Individual Therapy  Sigurd Pugh 07/16/2013, 11:33 AM

## 2013-07-16 NOTE — Progress Notes (Signed)
ANTICOAGULATION CONSULT NOTE - Follow up  Pharmacy Consult for Warfarin Indication: VTE prophylaxis  No Known Allergies  Patient Measurements: Height: 6' (182.9 cm) Weight: 208 lb 11.2 oz (94.666 kg) IBW/kg (Calculated) : 77.6  Vital Signs: Temp: 98.3 F (36.8 C) (03/14 0550) Temp src: Oral (03/14 0550) BP: 126/70 mmHg (03/14 0556) Pulse Rate: 96 (03/14 0556)  Labs:  Recent Labs  07/14/13 0645 07/15/13 0548 07/16/13 0500  LABPROT 19.8* 22.3* 28.3*  INR 1.74* 2.03* 2.77*    Estimated Creatinine Clearance: 108.4 ml/min (by C-G formula based on Cr of 0.71).  Assessment: 57 yoM s/p bilateral total knee arthroplasty 3/6 on coumadin for post-op for VTE prophylaxis. Per ortho plan: coumadin x 4 weeks followed by 81 mg ASA x 4 weeks. INR 2.03>>2.77, trending up. No new CBC.  Cardiovascular: HLD, HTN, VSS, lisinopril/HCTZ PTA, Holding now d/t orthostasis.  Endocrinology: glu 115 on BMET 3/11.  Gastrointestinal / Nutrition: tolerating regular diet, on miralax  Neurology: PRN tramadol, oxyIR, robaxin  Nephrology: scr 0.71, lytes ok  Pulmonary: sats good on RA  Hematology / Oncology: ABLA, hgb 10.2, plt 221, on Niferex  PTA Medication Issues: lisinopril/HCTZ  Best Practices: lovenox/coumadin  Goal of Therapy:  INR 2-3 Monitor platelets by anticoagulation protocol: Yes   Plan:  Give Coumadin only 2.5mg  po x 1 tonight to slow upward trend.   Sophee Mckimmy S. Alford Highland, PharmD, [facility] Clinical Staff Pharmacist Pager [phone]  07/16/2013  1:11 PM

## 2013-07-16 NOTE — Progress Notes (Signed)
Occupational Therapy Session Note  Patient Details  Name: Jorge Gray MRN: [id number] Date of Birth: 03-May-1946  Today's Date: 07/16/2013 Time: 1425-1510 Time Calculation (min): 45 min  Skilled Therapeutic Interventions/Progress Updates: Patient stated that he normally works out UB and LB regularly at his [employer] and asked to complete UB strengthening and that he felt like this would help his overall strengthening.  He completed UB strengthening w/c level via universal machine and bar bells.    Patient wheeled himself back to his room in his w/c and visited with a golfing friend who was waiting inhis room.  Call bell and phone were placed within reach of the patient.    Therapy Documentation Precautions:  Precautions Precautions: Fall;Knee Precaution Comments: instructed pt on L KI use for amb and proper application ; WBAT bilaterally Required Braces or Orthoses: Knee Immobilizer - Left Knee Immobilizer - Right: Discontinue once straight leg raise with < 10 degree lag Knee Immobilizer - Left: Discontinue once straight leg raise with < 10 degree lag Restrictions Weight Bearing Restrictions: Yes RLE Weight Bearing: Weight bearing as tolerated LLE Weight Bearing: Weight bearing as tolerated   Pain:  denied    See FIM for current functional status  Therapy/Group: Individual Therapy  Alfredia Ferguson [facility] 07/16/2013, 4:06 PM

## 2013-07-17 DIAGNOSIS — M1710 Unilateral primary osteoarthritis, unspecified knee: Secondary | ICD-10-CM | POA: Diagnosis not present

## 2013-07-17 DIAGNOSIS — I10 Essential (primary) hypertension: Secondary | ICD-10-CM | POA: Diagnosis not present

## 2013-07-17 DIAGNOSIS — D62 Acute posthemorrhagic anemia: Secondary | ICD-10-CM | POA: Diagnosis not present

## 2013-07-17 DIAGNOSIS — IMO0002 Reserved for concepts with insufficient information to code with codable children: Secondary | ICD-10-CM | POA: Diagnosis not present

## 2013-07-17 LAB — PROTIME-INR
INR: 2.69 — ABNORMAL HIGH (ref 0.00–1.49)
PROTHROMBIN TIME: 27.7 s — AB (ref 11.6–15.2)

## 2013-07-17 MED ORDER — WARFARIN SODIUM 4 MG PO TABS
4.0000 mg | ORAL_TABLET | Freq: Once | ORAL | Status: AC
  Administered 2013-07-17: 4 mg via ORAL
  Filled 2013-07-17: qty 1

## 2013-07-17 NOTE — Progress Notes (Signed)
Pt used CPM on LLE 0-90 from 1530 to 1630. CPM placed on RLE 0-100 at 1630.

## 2013-07-17 NOTE — Progress Notes (Signed)
ANTICOAGULATION CONSULT NOTE - Follow up  Pharmacy Consult for Warfarin Indication: VTE prophylaxis  No Known Allergies  Patient Measurements: Height: 6' (182.9 cm) Weight: 208 lb 11.2 oz (94.666 kg) IBW/kg (Calculated) : 77.6  Vital Signs: Temp: 98.1 F (36.7 C) (03/15 0406) Temp src: Oral (03/15 0406) BP: 143/74 mmHg (03/15 0408) Pulse Rate: 76 (03/15 0408)  Labs:  Recent Labs  07/15/13 0548 07/16/13 0500 07/17/13 0552  LABPROT 22.3* 28.3* 27.7*  INR 2.03* 2.77* 2.69*    Estimated Creatinine Clearance: 108.4 ml/min (by C-G formula based on Cr of 0.71).  Assessment: 31 yoM s/p bilateral total knee arthroplasty 3/6 on coumadin for post-op for VTE prophylaxis. Per ortho plan: coumadin x 4 weeks followed by 81 mg ASA x 4 weeks. INR 2.03>>2.77>>2.69. No new CBC.  Cardiovascular: HLD, HTN, VSS, lisinopril/HCTZ PTA, Holding now d/t orthostasis. 143/74, HR 76  Endocrinology: glu 115 on BMET 3/11.  Gastrointestinal / Nutrition: tolerating regular diet, on miralax  Neurology: PRN tramadol, oxyIR, robaxin  Nephrology: scr 0.71, lytes ok  Pulmonary: sats good on RA  Hematology / Oncology: ABLA, hgb 10.2, plt 221, on Niferex  PTA Medication Issues: lisinopril/HCTZ  Best Practices: lovenox/coumadin  Goal of Therapy:  INR 2-3 Monitor platelets by anticoagulation protocol: Yes   Plan:  Coumadin 4mg  po x 1 tonight.   Leonia Heatherly S. Alford Highland, PharmD, [facility] Clinical Staff Pharmacist Pager [phone]  07/17/2013  10:34 AM

## 2013-07-17 NOTE — Progress Notes (Signed)
Jorge Gray is a 68 y.o. male 1946/03/04 [id number]  Subjective: No new complaints. No new problems. Slept well. Feeling OK.  Objective: Vital signs in last 24 hours: Temp:  [98.1 F (36.7 C)-98.8 F (37.1 C)] 98.1 F (36.7 C) (03/15 0406) Pulse Rate:  [76-89] 76 (03/15 0408) Resp:  [17-20] 17 (03/15 0408) BP: (125-156)/(71-79) 143/74 mmHg (03/15 0408) SpO2:  [95 %-98 %] 96 % (03/15 0408) Weight change:  Last BM Date: 07/16/13  Intake/Output from previous day: 03/14 0701 - 03/15 0700 In: 1080 [P.O.:1080] Out: -  Last cbgs: CBG (last 3)  No results found for this basename: GLUCAP,  in the last 72 hours   Physical Exam General: No apparent distress   HEENT: not dry Lungs: Normal effort. Lungs clear to auscultation, no crackles or wheezes. Cardiovascular: Regular rate and rhythm, no edema Abdomen: S/NT/ND; BS(+) Musculoskeletal:  unchanged Neurological: No new neurological deficits Wounds: B knees - clean wounds   Skin: clear  Aging changes Mental state: Alert, oriented, cooperative    Lab Results: BMET    Component Value Date/Time   NA 137 07/13/2013 0625   K 3.6* 07/13/2013 0625   CL 99 07/13/2013 0625   CO2 26 07/13/2013 0625   GLUCOSE 115* 07/13/2013 0625   BUN 13 07/13/2013 0625   CREATININE 0.71 07/13/2013 0625   CALCIUM 8.5 07/13/2013 0625   GFRNONAA >90 07/13/2013 0625   GFRAA >90 07/13/2013 0625   CBC    Component Value Date/Time   WBC 8.2 07/13/2013 0625   RBC 3.72* 07/13/2013 0625   HGB 10.2* 07/13/2013 0625   HCT 29.9* 07/13/2013 0625   PLT 221 07/13/2013 0625   MCV 80.4 07/13/2013 0625   MCH 27.4 07/13/2013 0625   MCHC 34.1 07/13/2013 0625   RDW 13.6 07/13/2013 0625   LYMPHSABS 1.5 07/13/2013 0625   MONOABS 1.1* 07/13/2013 0625   EOSABS 0.3 07/13/2013 0625   BASOSABS 0.0 07/13/2013 0625    Studies/Results: No results found.  Medications: I have reviewed the patient's current medications.  Assessment/Plan:  1. DVT Prophylaxis/Anticoagulation:  Pharmaceutical: Coumadin--INR till coumadin therapeutic.  2. Pain Management: oxycodone for breakthrough pain, 5-15mg  prn  -improving control  3. Mood: LCSW to follow for evaluation and support.  4. Neuropsych: This patient is capable of making decisions on his own behalf.  5. ABLA: Added iron supplement. hgb 10.2---recheck monday  6. Orthostasis: Push po fluids. Holding BP medications for now. orthostasis better  -may need to resume prinizide soon (or at least part of combo)  7. Mild hypokalemia: supplemented  Cont Rx. OK grounds pass     Length of stay, days: 5  Walker Kehr , MD 07/17/2013, 9:03 AM

## 2013-07-18 ENCOUNTER — Encounter (HOSPITAL_COMMUNITY): Payer: Managed Care, Other (non HMO)

## 2013-07-18 ENCOUNTER — Inpatient Hospital Stay (HOSPITAL_COMMUNITY): Payer: Managed Care, Other (non HMO)

## 2013-07-18 DIAGNOSIS — D62 Acute posthemorrhagic anemia: Secondary | ICD-10-CM | POA: Diagnosis not present

## 2013-07-18 DIAGNOSIS — M1710 Unilateral primary osteoarthritis, unspecified knee: Secondary | ICD-10-CM | POA: Diagnosis not present

## 2013-07-18 DIAGNOSIS — IMO0002 Reserved for concepts with insufficient information to code with codable children: Secondary | ICD-10-CM | POA: Diagnosis not present

## 2013-07-18 DIAGNOSIS — Z96659 Presence of unspecified artificial knee joint: Secondary | ICD-10-CM | POA: Diagnosis not present

## 2013-07-18 LAB — CBC
HEMATOCRIT: 30.1 % — AB (ref 39.0–52.0)
Hemoglobin: 9.8 g/dL — ABNORMAL LOW (ref 13.0–17.0)
MCH: 26.6 pg (ref 26.0–34.0)
MCHC: 32.6 g/dL (ref 30.0–36.0)
MCV: 81.8 fL (ref 78.0–100.0)
Platelets: 319 10*3/uL (ref 150–400)
RBC: 3.68 MIL/uL — AB (ref 4.22–5.81)
RDW: 13.9 % (ref 11.5–15.5)
WBC: 8.3 10*3/uL (ref 4.0–10.5)

## 2013-07-18 LAB — PROTIME-INR
INR: 2.52 — ABNORMAL HIGH (ref 0.00–1.49)
Prothrombin Time: 26.3 seconds — ABNORMAL HIGH (ref 11.6–15.2)

## 2013-07-18 MED ORDER — OXYCODONE HCL 5 MG PO TABS: ORAL_TABLET | ORAL | Status: DC

## 2013-07-18 MED ORDER — WARFARIN SODIUM 5 MG PO TABS
5.0000 mg | ORAL_TABLET | Freq: Once | ORAL | Status: DC
  Filled 2013-07-18: qty 1

## 2013-07-18 MED ORDER — METHOCARBAMOL 500 MG PO TABS: 500.0000 mg | ORAL_TABLET | Freq: Four times a day (QID) | ORAL | Status: DC | PRN

## 2013-07-18 MED ORDER — TRAMADOL HCL 50 MG PO TABS: 50.0000 mg | ORAL_TABLET | Freq: Four times a day (QID) | ORAL | Status: DC | PRN

## 2013-07-18 MED ORDER — WARFARIN SODIUM 5 MG PO TABS: 5.0000 mg | ORAL_TABLET | Freq: Every day | ORAL | Status: DC

## 2013-07-18 MED ORDER — POLYSACCHARIDE IRON COMPLEX 150 MG PO CAPS: 150.0000 mg | ORAL_CAPSULE | Freq: Every day | ORAL | Status: DC

## 2013-07-18 NOTE — Progress Notes (Signed)
Energizer charger found in patient room while room was being cleaned.  Patient contacted and requests the charger be mailed.  Belinda, Unit Secretary to follow up and mail item.

## 2013-07-18 NOTE — Progress Notes (Signed)
Social Work Discharge Note Discharge Note  The overall goal for the admission was met for:   Discharge location: [place] BUT THERE IN THE EVENINGS  Length of Stay: Yes-6 DAYS  Discharge activity level: Yes-MOD/I LEVEL  Home/community participation: Yes  Services provided included: MD, RD, PT, OT, RN, CM, Pharmacy and SW  Financial Services: Private Insurance: [place]  Follow-up services arranged: Outpatient: [facility] 3/17 11;30-1;00 PM, DME: [facility] and Patient/Family has no preference for HH/DME agencies  Comments (or additional information):MET GOALS QUICKLY-READY FOR DISCHARGE  Patient/Family verbalized understanding of follow-up arrangements: Yes  Individual responsible for coordination of the follow-up plan: SELF & KAREN-WIFE  Confirmed correct DME delivered: Elease Hashimoto 07/18/2013    Elease Hashimoto

## 2013-07-18 NOTE — Progress Notes (Addendum)
Subjective/Complaints: Feeling well. Knee rom good. Pain controlled A 12 point review of systems has been performed and if not noted above is otherwise negative.   Objective: Vital Signs: Blood pressure 145/76, pulse 94, temperature 98.4 F (36.9 C), temperature source Oral, resp. rate 19, height 6' (1.829 m), weight 94.666 kg (208 lb 11.2 oz), SpO2 98.00%. No results found.  Recent Labs  07/18/13 0548  WBC 8.3  HGB 9.8*  HCT 30.1*  PLT 319   No results found for this basename: NA, K, CL, CO, GLUCOSE, BUN, CREATININE, CALCIUM,  in the last 72 hours CBG (last 3)  No results found for this basename: GLUCAP,  in the last 72 hours  Wt Readings from Last 3 Encounters:  07/13/13 94.666 kg (208 lb 11.2 oz)  07/08/13 89.812 kg (198 lb)  07/08/13 89.812 kg (198 lb)    Physical Exam:  Constitutional: He is oriented to person, place, and time. He appears well-developed and well-nourished.  HENT:  Head: Normocephalic and atraumatic.  Right Ear: External ear normal.  Left Ear: External ear normal.  Mouth/Throat: Oropharynx is clear and moist.  Eyes: Conjunctivae and EOM are normal. Pupils are equal, round, and reactive to light.  Cardiovascular: Normal rate, regular rhythm and normal heart sounds.  Respiratory: Effort normal and breath sounds normal. No wheezes, rales, or rhonchi  GI: Soft. Bowel sounds are normal.  Neurological: He is alert and oriented to person, place, and time. No sensory deficit.  Motor strength is 5/5 in bilateral deltoid, bicep, tricep, grip 1+ to 2-/5 bilateral hip flexors and knee extensors 4/5 left ankle dorsiflexor plantar flexor 3+/5 right ankle dorsiflexor plantar flexor  Skin: Skin is warm and dry. There is erythema.  Edema around bilateral knee incision which is improving. Minimal to no drainage. Psychiatric: He has a normal mood and affect.  Knee ROM: almost 90 deg bilaterally with PROM     Assessment/Plan: 1. Functional deficits  secondary to bilateral OA of knees s/p TKA's which require 3+ hours per day of interdisciplinary therapy in a comprehensive inpatient rehab setting. Physiatrist is providing close team supervision and 24 hour management of active medical problems listed below. Physiatrist and rehab team continue to assess barriers to discharge/monitor patient progress toward functional and medical goals.  Home today. No cpm needed. Ortho follow up   FIM: FIM - Bathing Bathing Steps Patient Completed: Chest;Right Arm;Left Arm;Abdomen;Front perineal area;Right upper leg;Left upper leg;Buttocks;Right lower leg (including foot);Left lower leg (including foot) Bathing: 6: More than reasonable amount of time (remains seated & leans to wash peri area & bottom)  FIM - Upper Body Dressing/Undressing Upper body dressing/undressing steps patient completed: Thread/unthread right sleeve of pullover shirt/dresss;Thread/unthread left sleeve of pullover shirt/dress;Put head through opening of pull over shirt/dress;Pull shirt over trunk Upper body dressing/undressing: 5: Supervision: Safety issues/verbal cues FIM - Lower Body Dressing/Undressing Lower body dressing/undressing steps patient completed: Thread/unthread right pants leg;Thread/unthread left pants leg;Pull pants up/down;Fasten/unfasten right shoe;Fasten/unfasten left shoe;Don/Doff right shoe;Don/Doff left shoe Lower body dressing/undressing: 5: Set-up assist to: Don/Doff TED stocking  FIM - Toileting Toileting steps completed by patient: Adjust clothing prior to toileting;Performs perineal hygiene;Adjust clothing after toileting Toileting Assistive Devices: Grab bar or rail for support Toileting: 5: Supervision: Safety issues/verbal cues  FIM - Radio producer Devices: Walker;Grab bars;Elevated toilet seat Toilet Transfers: 4-To toilet/BSC: Min A (steadying Pt. > 75%);4-From toilet/BSC: Min A (steadying Pt. > 75%)  FIM - Bed/Chair  Transfer Bed/Chair  Transfer Assistive Devices: Arm rests;Walker Bed/Chair Transfer: 5: Bed > Chair or W/C: Supervision (verbal cues/safety issues);5: Chair or W/C > Bed: Supervision (verbal cues/safety issues)  FIM - Locomotion: Wheelchair Distance: 150 Locomotion: Wheelchair: 5: Travels 150 ft or more: maneuvers on rugs and over door sills with supervision, cueing or coaxing FIM - Locomotion: Ambulation Locomotion: Ambulation Assistive Devices: Administrator Ambulation/Gait Assistance: 5: Supervision Locomotion: Ambulation: 5: Travels 150 ft or more with supervision/safety issues (175)  Comprehension Comprehension Mode: Auditory Comprehension: 7-Follows complex conversation/direction: With no assist  Expression Expression Mode: Verbal Expression: 7-Expresses complex ideas: With no assist  Social Interaction Social Interaction: 7-Interacts appropriately with others - No medications needed.  Problem Solving Problem Solving: 7-Solves complex problems: Recognizes & self-corrects  Memory Memory: 7-Complete Independence: No helper  Medical Problem List and Plan:  1. DVT Prophylaxis/Anticoagulation: Pharmaceutical: Coumadin--INR till coumadin therapeutic.  2. Pain Management: oxycodone for breakthrough pain, 5-15mg  prn   -improving control 3. Mood: LCSW to follow for evaluation and support.  4. Neuropsych: This patient is capable of making decisions on his own behalf.  5. ABLA: Added iron supplement. hgb 9.8   6. Orthostasis: Push po fluids. Holding BP medications for now. orthostasis better  -can resume prinizide as an outpt 7. Mild hypokalemia: supplemented    LOS (Days) 6 A FACE TO FACE EVALUATION WAS PERFORMED  Itzayana Pardy T 07/18/2013 8:06 AM

## 2013-07-18 NOTE — Progress Notes (Signed)
Patient and wife given discharge instructions by Algis Liming, PA; all questions answered.  Patient escorted via wheelchair by Hetty Blend, NT to wife's car.

## 2013-07-18 NOTE — Progress Notes (Signed)
ANTICOAGULATION CONSULT NOTE - Follow up  Pharmacy Consult for Warfarin Indication: VTE prophylaxis  No Known Allergies  Patient Measurements: Height: 6' (182.9 cm) Weight: 208 lb 11.2 oz (94.666 kg) IBW/kg (Calculated) : 77.6  Vital Signs: Temp: 98.4 F (36.9 C) (03/16 0615) Temp src: Oral (03/16 0615) BP: 145/76 mmHg (03/16 0620) Pulse Rate: 94 (03/16 0620)  Labs:  Recent Labs  07/16/13 0500 07/17/13 0552 07/18/13 0548  HGB  --   --  9.8*  HCT  --   --  30.1*  PLT  --   --  319  LABPROT 28.3* 27.7* 26.3*  INR 2.77* 2.69* 2.52*    Estimated Creatinine Clearance: 108.4 ml/min (by C-G formula based on Cr of 0.71).  Assessment: 60 yoM s/p bilateral total knee arthroplasty 3/6 on coumadin for post-op for VTE prophylaxis. Per ortho plan: coumadin x 4 weeks followed by 81 mg ASA x 4 weeks. INR 2.52. CBC stable, no new cbc.  Goal of Therapy:  INR 2-3 Monitor platelets by anticoagulation protocol: Yes   Plan:  1) Warfarin 5 mg tonight at 1800 2) Daily PT/INR while inpatient   Maryanna Shape, PharmD, BCPS  Clinical Pharmacist  Pager: [phone]  07/18/2013  2:05 PM

## 2013-07-18 NOTE — Progress Notes (Signed)
Occupational Therapy Discharge Summary  Patient Details  Name: DELRON COMER MRN: [id number] Date of Birth: 1946/04/15  Today's Date: 07/18/2013  Patient has met 10 of 10 long term goals due to improved activity tolerance, ability to compensate for deficits, functional use of  RIGHT lower and LEFT lower extremity and pain management.  Pt made excellent progress with BADLs and IADLs during this admission.  Pt is mod I for simple meal prep and home mgmt tasks and employs appropriate energy conservation strategies.  Pt's wife has not been present for therapy sessions. Patient to discharge at overall Modified Independent level and able to guide caregiver is assist is needed.  Reasons goals not met: n/a secondary to all goals met  Recommendation:  No follow up OT recommended at this time.  Equipment: No equipment provided Pt has seat in shower and elevated toilet seat  Reasons for discharge: treatment goals met and discharge from hospital  Patient/family agrees with progress made and goals achieved: Yes  OT Discharge Precautions/Restrictions  Precautions Precautions: Fall;Knee Precaution Comments: KI d/c due to (+) SLR B Restrictions Weight Bearing Restrictions: Yes RLE Weight Bearing: Weight bearing as tolerated LLE Weight Bearing: Weight bearing as tolerated ADL   See FIM Vision/Perception  Vision - History Baseline Vision: No visual deficits Patient Visual Report: No change from baseline Vision - Assessment Vision Assessment: Vision not tested Perception Perception: Within Functional Limits Praxis Praxis: Intact  Cognition Overall Cognitive Status: Within Functional Limits for tasks assessed Arousal/Alertness: Awake/alert Orientation Level: Oriented X4 Attention: Selective;Alternating Selective Attention: Appears intact Alternating Attention: Appears intact Memory: Appears intact Awareness: Appears intact Problem Solving: Appears intact Safety/Judgment: Appears  intact Sensation Sensation Light Touch: Appears Intact Hot/Cold: Appears Intact Proprioception: Appears Intact Coordination Gross Motor Movements are Fluid and Coordinated: Yes Fine Motor Movements are Fluid and Coordinated: Yes Coordination and Movement Description: decreased due to pain and decreased B knee ROM Motor  Motor Motor: Within Functional Limits Mobility  Bed Mobility Bed Mobility: Supine to Sit;Sit to Supine Supine to Sit: 6: Modified independent (Device/Increase time) Sit to Supine: 6: Modified independent (Device/Increase time) Transfers Sit to Stand: 6: Modified independent (Device/Increase time) Stand to Sit: 6: Modified independent (Device/Increase time)  Trunk/Postural Assessment  Cervical Assessment Cervical Assessment: Within Functional Limits Thoracic Assessment Thoracic Assessment: Within Functional Limits Lumbar Assessment Lumbar Assessment: Within Functional Limits Postural Control Postural Control: Within Functional Limits  Balance Static Sitting Balance Static Sitting - Balance Support: No upper extremity supported;Feet supported Static Sitting - Level of Assistance: 7: Independent Dynamic Sitting Balance Dynamic Sitting - Balance Support: No upper extremity supported;Right upper extremity supported;Left upper extremity supported;Feet supported;Bilateral upper extremity supported Dynamic Sitting - Level of Assistance: 7: Independent Dynamic Sitting - Balance Activities: Forward lean/weight shifting;Lateral lean/weight shifting;Reaching for objects;Reaching for weighted objects;Reaching across midline Static Standing Balance Static Standing - Balance Support: Bilateral upper extremity supported Static Standing - Level of Assistance: 6: Modified independent (Device/Increase time) Dynamic Standing Balance Dynamic Standing - Balance Support: Bilateral upper extremity supported;Left upper extremity supported;Right upper extremity supported Dynamic  Standing - Level of Assistance: 6: Modified independent (Device/Increase time) Dynamic Standing - Balance Activities: Forward lean/weight shifting;Lateral lean/weight shifting;Reaching for objects;Reaching across midline Extremity/Trunk Assessment RUE Assessment RUE Assessment: Within Functional Limits LUE Assessment LUE Assessment: Within Functional Limits  See FIM for current functional status  Leroy Libman 07/18/2013, 11:59 AM

## 2013-07-18 NOTE — Discharge Instructions (Signed)
Inpatient Rehab Discharge Instructions  Jorge Gray Discharge date and time:  07/18/13  Activities/Precautions/ Functional Status: Activity: activity as tolerated. No Driving till cleared by surgeon.  Diet: regular diet Wound Care: keep wound clean and dry  Functional status:  ___ No restrictions     ___ Walk up steps independently ___ 24/7 supervision/assistance   ___ Walk up steps with assistance _X__ Intermittent supervision/assistance  ___ Bathe/dress independently _X__ Walk with walker    ___ Bathe/dress with assistance ___ Walk Independently    ___ Shower independently ___ Walk with assistance    ___ Shower with assistance _X__ No alcohol     ___ Return to work/school ________  Special Instructions: 1. Start Aspirin 81 mg daily for a month--from 08/03/13 to 09/02/13 2. Wear support stockings for three weeks.  3. Have your blood pressure checked every few days--at drug store. Or daily if you have BP cuff.      COMMUNITY REFERRALS UPON DISCHARGE:    Outpatient: PT  Agency:[facility] HDQQI:[phone] Date of Last Service:07/18/2013  Appointment Date/Time:TUESDAY  3/17 11;30-1;00 PM  Medical Equipment/Items [place]   [phone]   My questions have been answered and I understand these instructions. I will adhere to these goals and  he provided educational materials after my discharge from the hospital.  Patient/Caregiver Signature _______________________________ Date __________  Clinician Signature _______________________________________ Date __________  Please bring this form and your medication list with you to all your follow-up doctor's appointments.

## 2013-07-18 NOTE — Discharge Summary (Signed)
Physician Discharge Summary  Patient ID: PEDER ALLUMS MRN: [id number] DOB/AGE: 1946/04/25 68 y.o.  Admit date: 07/12/2013 Discharge date: 07/18/2013  Discharge Diagnoses:  Principal Problem:   DJD (degenerative joint disease) of knee Active Problems:   Postoperative anemia due to acute blood loss   Discharged Condition: Stable  Significant Diagnostic Studies: Dg Chest 2 View  07/05/2013   CLINICAL DATA:  Preoperative radiograph.  Knee surgery.  EXAM: CHEST  2 VIEW  COMPARISON:  None.  FINDINGS: Cardiopericardial silhouette within normal limits. Mediastinal contours normal. Trachea midline. No airspace disease or effusion. Mild right basilar atelectasis. Left basilar atelectasis is also present adjacent to the cardiac apex.  IMPRESSION: No active cardiopulmonary disease.   Electronically Signed   By: Dereck Ligas M.D.   On: 07/05/2013 15:18    Labs:  Basic Metabolic Panel:  Recent Labs Lab 07/13/13 0625  NA 137  K 3.6*  CL 99  CO2 26  GLUCOSE 115*  BUN 13  CREATININE 0.71  CALCIUM 8.5    CBC:  Recent Labs Lab 07/12/13 0427 07/13/13 0625 07/18/13 0548  WBC 10.8* 8.2 8.3  NEUTROABS  --  5.3  --   HGB 10.0* 10.2* 9.8*  HCT 29.7* 29.9* 30.1*  MCV 80.9 80.4 81.8  PLT 206 221 319    CBG: No results found for this basename: GLUCAP,  in the last 168 hours  Brief HPI:   Jorge Gray is a 68 y.o. male with history of HTN, DDD, bilateral knee OA with failure of conservative therapy. She elected to undergo B-TKR on 07/08/13 by Dr. Maureen Ralphs. Post op WBAT and on coumadin for DVT prophylaxis. Follow up labs with leucocytosis as well as ABLA. Therapies initiated and CIR recommended by rehab team. Ortho recommends coumadin X 3 weeks followed by 81 mg ASA x 4 weeks    Hospital Course: Audiel JARET COPPEDGE was admitted to rehab 07/12/2013 for inpatient therapies to consist of PT, ST and OT at least three hours five days a week. Past admission physiatrist, therapy team  and rehab RN have worked together to provide customized collaborative inpatient rehab. He was maintained on coumadin for DVT prophylaxis. Blood pressures were monitored on bid basis and prinzide was held during his stay. He is to follow up with PMD for prior to resumption of medications. Bilateral knee incisions are healing well without s/s of infections. Leucocytosis has resolved. Right knee flexion is at 97 degree with 12 degree extension and left knee is at 94 degrees with -10 degree extension. He  has progressed to independent level and will continue to receive outpatient PT at [facility] therapy past discharge. He is to continue on coumadin thorough 05/04/14. INR is therapeutic at 2.52 and he's discharged on 5 mg coumadin daily. Dr. Lysbeth Galas has agree to follow and adjust dose as needed past discharge.    Rehab course: During patient's stay in rehab weekly team conferences were held to monitor patient's progress, set goals and discuss barriers to discharge. Patient has had improvement in activity tolerance, balance, postural control, as well as ability to compensate for deficits. He is independent for transfers and is able to ambulate 200 feet with RW. He is able to perform all ADL tasks independently.   Disposition:  Home  Diet: Regular  Special Instructions: 1. NO driving till cleared by MD. 2. Go by Dr. Daisy Blossom office tomorrow to have protime drawn.  3. Check blood pressure daily at home.     Medication List  STOP taking these medications       acetaminophen 325 MG tablet  Commonly known as:  TYLENOL     bisacodyl 10 MG suppository  Commonly known as:  DULCOLAX     enoxaparin 30 MG/0.3ML injection  Commonly known as:  LOVENOX     lisinopril-hydrochlorothiazide 10-12.5 MG per tablet  Commonly known as:  PRINZIDE,ZESTORETIC     metoCLOPramide 5 MG tablet  Commonly known as:  REGLAN     ondansetron 4 MG tablet  Commonly known as:  ZOFRAN      TAKE these medications        DSS 100 MG Caps  Take 100 mg by mouth 2 (two) times daily.     iron polysaccharides 150 MG capsule  Commonly known as:  NIFEREX  Take 1 capsule (150 mg total) by mouth daily. For anemia     methocarbamol 500 MG tablet  Commonly known as:  ROBAXIN  Take 1 tablet (500 mg total) by mouth every 6 (six) hours as needed for muscle spasms.     oxyCODONE 5 MG immediate release tablet Rx # 90 pills  Commonly known as:  Oxy IR/ROXICODONE  Use one-two pills as needed for pain.     polyethylene glycol packet  Commonly known as:  MIRALAX / GLYCOLAX  Take 17 g by mouth daily as needed for mild constipation.     traMADol 50 MG tablet--Rx # 60 pills  Commonly known as:  ULTRAM  Take 1-2 tablets (50-100 mg total) by mouth every 6 (six) hours as needed (pain).     warfarin 5 MG tablet  Commonly known as:  COUMADIN  - Take 1 tablet (5 mg total) by mouth daily at 6 PM. Take one pill daily with supper.  - Dose may need to be adjusted to keep INR in 2-3 range.       Follow-up Information   Follow up with Meredith Staggers, MD On 08/31/2013. (Be there at 12:30  for 1 pm  appointment)    Specialty:  Physical Medicine and Rehabilitation   Contact information:   [address], [place] [address] [phone]       Follow up with Gearlean Alf, MD. Call today. (for post op check)    Specialty:  Orthopedic Surgery   Contact information:   [address] [place] [address] [phone]       Follow up with Carlus Pavlov, MD. (Go by the office tomorrow before 12 noon or after 2 pm for coumadin check. Will need offic appointment for follow up on blood pressure. )    Specialty:  Family Medicine   Contact information:   [place] [place] [address] [phone]       Signed: Bary Leriche 07/18/2013, 4:59 PM

## 2013-07-18 NOTE — Progress Notes (Signed)
Physical Therapy Discharge Summary  Patient Details  Name: Jorge Gray MRN: [id number] Date of Birth: [birth year]  Today's Date: 07/18/2013 Time: 1000-1030 Time Calculation (min): 30 min  Patient has met 8 of 9 long term goals due to improved activity tolerance, improved balance, increased strength, increased range of motion, decreased pain and functional use of  right lower extremity and left lower extremity.  Patient to discharge at an ambulatory level Modified Independent in home environment and supervision in community environment. Family training not performed as patient is able to direct care as needed.    Reasons goals not met: Patient did not meet LTG regarding community ambulation of 300' as pt is only able to consistently amb up to 200' due to pain and decreased functional endurance.   Recommendation:  Patient will benefit from ongoing skilled PT services in outpatient setting to continue to advance safe functional mobility, address ongoing impairments in decreased functional endurance, decreased B LE strength, decreased B knee ROM, decreased coordination and minimize fall risk.  Equipment: Conservation officer, nature  Reasons for discharge: treatment goals met and discharge from hospital  Patient/family agrees with progress made and goals achieved: Yes  Skilled Therapeutic Intervention 1:1. Pt received supine in bed, ready for therapy. Focus this session on formal reassessment of pt's mobility, strength and ROM. See detailed objective information below. Pt able to demonstrate bed mobility (elevated to match height of bed at home), transfers (furniture and car), ambulation w/ RW (up to 200') and stair negotiaiton (8 steps) at mod(I) level. Pt demonstrates good understanding regarding safety (avoiding pillow under knee, use of RW) as well as B LE therex including: ankle pumps, knee flexion, quad sets and heel slides. Pt sitting EOB at end of session w/ all needs in reach. Pt mod(I) in room  w/ use of RW and RN aware.   PT Discharge Precautions/Restrictions Precautions Precautions: Fall;Knee Precaution Comments: KI d/c due to (+) SLR B Restrictions Weight Bearing Restrictions: Yes RLE Weight Bearing: Weight bearing as tolerated LLE Weight Bearing: Weight bearing as tolerated Vital Signs   Pain Pain Assessment Pain Score: Asleep Vision/Perception  Vision - History Baseline Vision: No visual deficits Patient Visual Report: No change from baseline Perception Perception: Within Functional Limits Praxis Praxis: Intact  Cognition Overall Cognitive Status: Within Functional Limits for tasks assessed Arousal/Alertness: Awake/alert Orientation Level: Oriented X4 Attention: Selective;Alternating Selective Attention: Appears intact Alternating Attention: Appears intact Memory: Appears intact Awareness: Appears intact Problem Solving: Appears intact Safety/Judgment: Appears intact Sensation Sensation Light Touch: Appears Intact Proprioception: Appears Intact Coordination Gross Motor Movements are Fluid and Coordinated: No Coordination and Movement Description: decreased due to pain and decreased B knee ROM Motor  Motor Motor: Within Functional Limits  Mobility Bed Mobility Bed Mobility: Supine to Sit;Sit to Supine Supine to Sit: 6: Modified independent (Device/Increase time) Sit to Supine: 6: Modified independent (Device/Increase time) Transfers Transfers: Yes Sit to Stand: 6: Modified independent (Device/Increase time) Stand to Sit: 6: Modified independent (Device/Increase time) Stand Pivot Transfers: 6: Modified independent (Device/Increase time) Locomotion  Ambulation Ambulation: Yes Ambulation/Gait Assistance: 6: Modified independent (Device/Increase time) Ambulation Distance (Feet): 200 Feet Assistive device: Rolling walker Gait Gait: Yes Gait Pattern: Impaired Gait Pattern: Step-through pattern;Decreased step length - right;Decreased step length  - left;Decreased hip/knee flexion - left;Decreased hip/knee flexion - right;Trunk flexed Stairs / Additional Locomotion Stairs: Yes Stairs Assistance: 6: Modified independent (Device/Increase time) Stair Management Technique: One rail Left;Sideways Number of Stairs: 8 Wheelchair Mobility Wheelchair Mobility: No (Amb is primary means of  mobility)  Trunk/Postural Assessment  Cervical Assessment Cervical Assessment: Within Functional Limits Thoracic Assessment Thoracic Assessment: Within Functional Limits Lumbar Assessment Lumbar Assessment: Within Functional Limits Postural Control Postural Control: Within Functional Limits  Balance Static Sitting Balance Static Sitting - Balance Support: No upper extremity supported;Feet supported Static Sitting - Level of Assistance: 7: Independent Dynamic Sitting Balance Dynamic Sitting - Balance Support: No upper extremity supported;Right upper extremity supported;Left upper extremity supported;Feet supported;Bilateral upper extremity supported Dynamic Sitting - Level of Assistance: 7: Independent Dynamic Sitting - Balance Activities: Forward lean/weight shifting;Lateral lean/weight shifting;Reaching for objects;Reaching for weighted objects;Reaching across midline Static Standing Balance Static Standing - Balance Support: Bilateral upper extremity supported Static Standing - Level of Assistance: 6: Modified independent (Device/Increase time) Dynamic Standing Balance Dynamic Standing - Balance Support: Bilateral upper extremity supported;Left upper extremity supported;Right upper extremity supported Dynamic Standing - Level of Assistance: 6: Modified independent (Device/Increase time) Dynamic Standing - Balance Activities: Forward lean/weight shifting;Lateral lean/weight shifting;Reaching for objects;Reaching across midline Extremity Assessment      RLE Assessment RLE Assessment: Exceptions to [facility] RLE AROM (degrees) RLE Overall AROM Comments:  Knee Flex: 97deg; Ext: -12deg RLE Strength RLE Overall Strength Comments: Pt demonstrates (+) SLR and improved sustained quad contraction LLE Assessment LLE Assessment: Exceptions to WFL LLE AROM (degrees) LLE Overall AROM Comments: Knee Flex: 94deg; Ext: -10deg LLE Strength LLE Overall Strength Comments: Pt demonstrates (+) SLR and improved sustained quad contraction  See FIM for current functional status  Gilmore Laroche 07/18/2013, 10:43 AM

## 2013-07-18 NOTE — Progress Notes (Signed)
Occupational Therapy Session Note  Patient Details  Name: Jorge Gray MRN: [id number] Date of Birth: [birth year]  Today's Date: 07/18/2013 Time: 1100-1135 Time Calculation (min): 35 min  Short Term Goals: Week 1:  OT Short Term Goal 1 (Week 1): STGs=LTGs  Skilled Therapeutic Interventions/Progress Updates:    Pt sitting EOB upon arrival.  Pt engaged in bathing at walk-in shower level and dressing with sit<>stand from bench.  Pt gathered clothing and supplies prior to amb with RW to BR to use toilet and take shower.  Pt completed grooming tasks while standing at sink.  Pt completed all tasks at mod I level.  Discussed home safety and bathroom safety.  Pt pleased with progress and ready to go home.  Therapy Documentation Precautions:  Precautions Precautions: Fall;Knee Precaution Comments: KI d/c due to (+) SLR B Required Braces or Orthoses: Knee Immobilizer - Left Knee Immobilizer - Right: Discontinue once straight leg raise with < 10 degree lag Knee Immobilizer - Left: Discontinue once straight leg raise with < 10 degree lag Restrictions Weight Bearing Restrictions: Yes RLE Weight Bearing: Weight bearing as tolerated LLE Weight Bearing: Weight bearing as tolerated General: General Amount of Missed OT Time (min): 25 Minutes   Pain: Pain Assessment Pain Assessment: 0-10 Pain Score: 2  Pain Type: Surgical pain Pain Location: Knee Pain Orientation: Right;Left Pain Descriptors / Indicators: Aching;Sore Pain Frequency: Intermittent Pain Onset: On-going Patients Stated Pain Goal: 2 Pain Intervention(s): RN made aware; repositioned Multiple Pain Sites: No  See FIM for current functional status  Therapy/Group: Individual Therapy  Leroy Libman 07/18/2013, 11:51 AM

## 2013-07-19 ENCOUNTER — Telehealth: Payer: Self-pay

## 2013-07-19 NOTE — Telephone Encounter (Signed)
Pharmacy ([facility]) sent a request over asking how often would you like patient to take Oxycodone 5 mg tablets? RX says use one-two pills as needed for pain. Contact # [phone].

## 2013-08-31 ENCOUNTER — Inpatient Hospital Stay: Payer: Managed Care, Other (non HMO) | Admitting: Physical Medicine & Rehabilitation

## 2015-05-23 DIAGNOSIS — R202 Paresthesia of skin: Secondary | ICD-10-CM | POA: Diagnosis not present

## 2015-05-23 DIAGNOSIS — R635 Abnormal weight gain: Secondary | ICD-10-CM | POA: Diagnosis not present

## 2015-05-23 DIAGNOSIS — D509 Iron deficiency anemia, unspecified: Secondary | ICD-10-CM | POA: Diagnosis not present

## 2015-05-23 DIAGNOSIS — M2540 Effusion, unspecified joint: Secondary | ICD-10-CM | POA: Diagnosis not present

## 2015-05-23 DIAGNOSIS — Z79899 Other long term (current) drug therapy: Secondary | ICD-10-CM | POA: Diagnosis not present

## 2015-05-23 DIAGNOSIS — I10 Essential (primary) hypertension: Secondary | ICD-10-CM | POA: Diagnosis not present

## 2015-05-23 DIAGNOSIS — K2960 Other gastritis without bleeding: Secondary | ICD-10-CM | POA: Diagnosis not present

## 2015-05-23 DIAGNOSIS — M79641 Pain in right hand: Secondary | ICD-10-CM | POA: Diagnosis not present

## 2015-07-02 DIAGNOSIS — S161XXA Strain of muscle, fascia and tendon at neck level, initial encounter: Secondary | ICD-10-CM | POA: Diagnosis not present

## 2015-07-24 IMAGING — CR DG CHEST 2V
2 series · 2 of 2 positions shown · non-contrast
Comparison: None.

CLINICAL DATA: Preoperative radiograph.  Knee surgery.

EXAM:
CHEST  2 VIEW

[w chest pa]
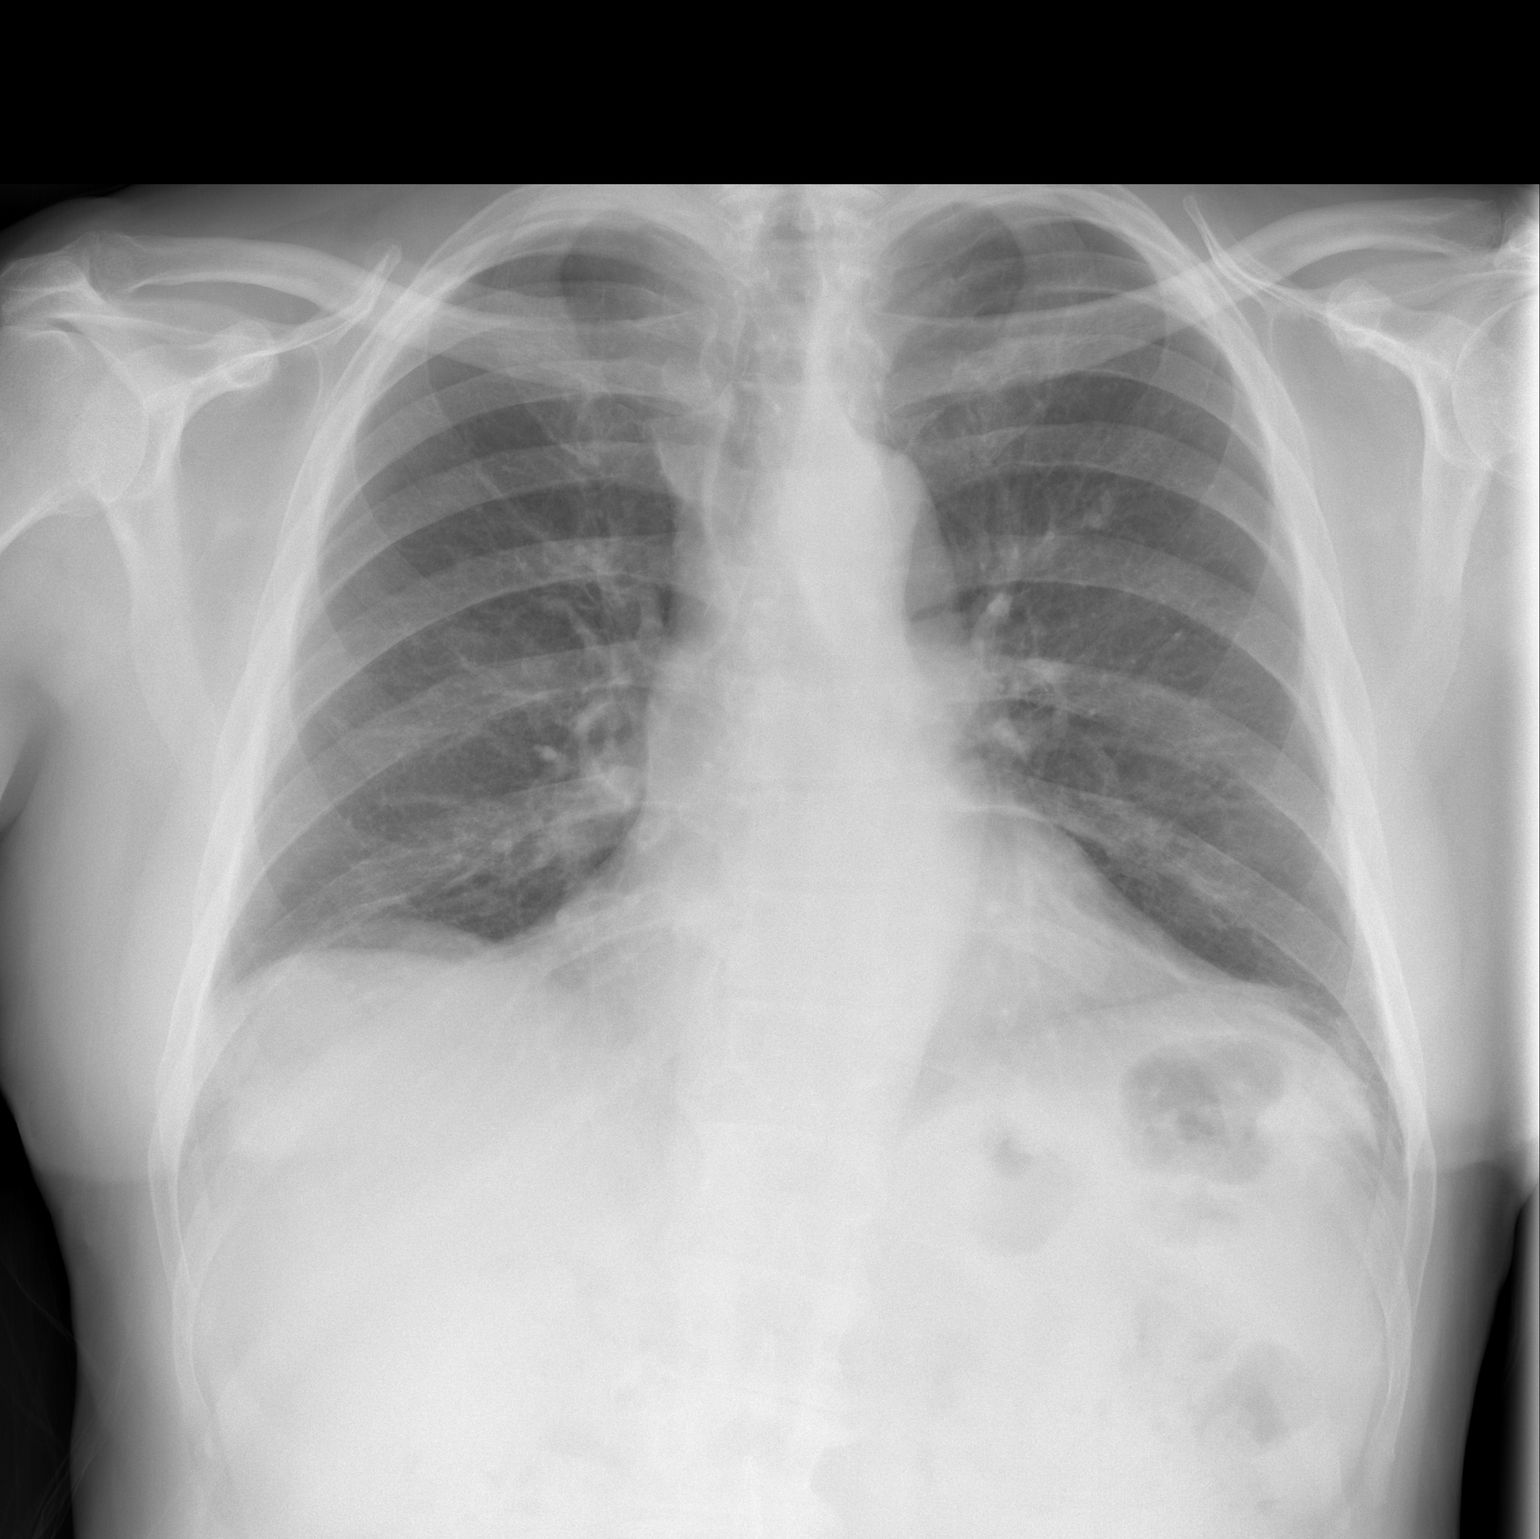

[w chest lat]
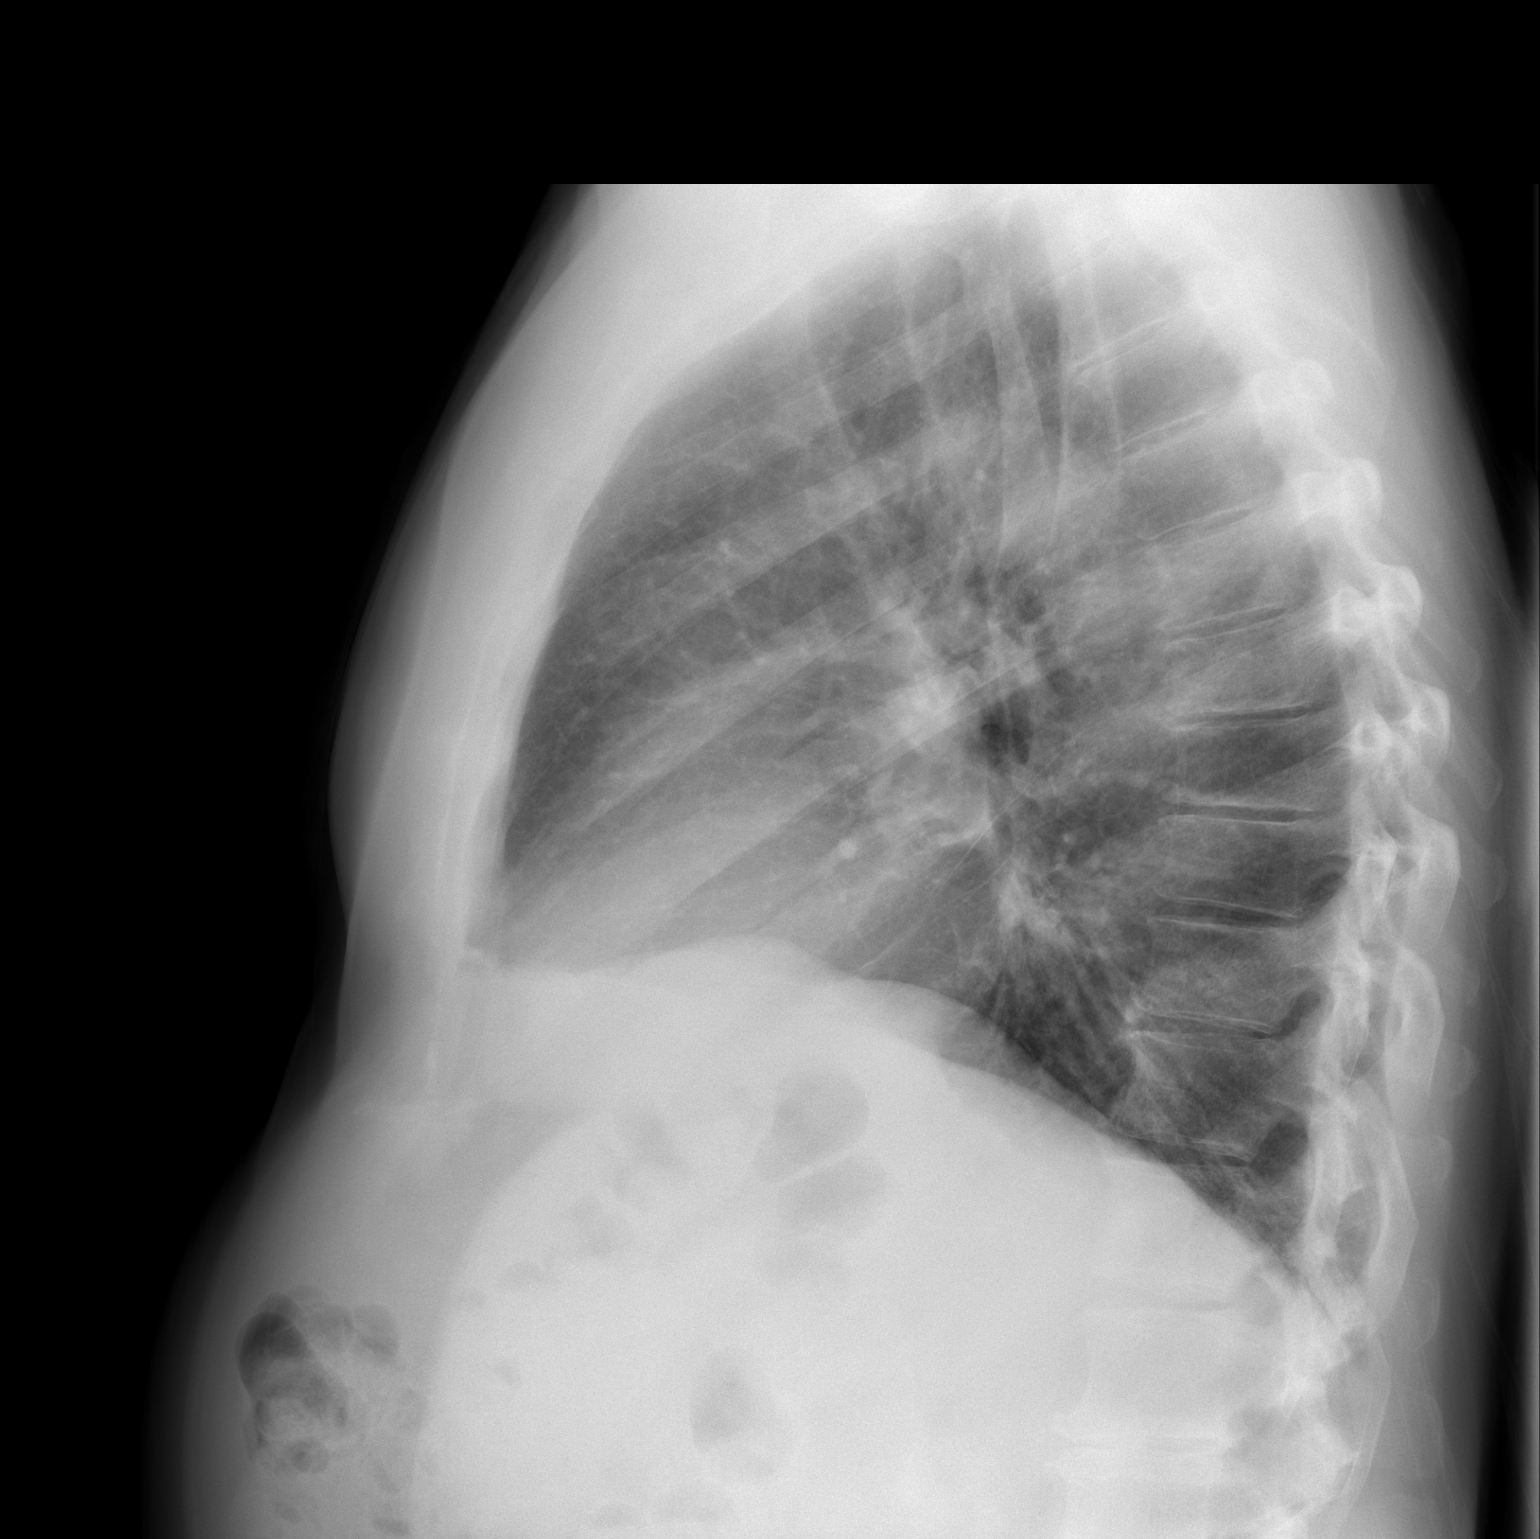

[2 of 2 positions shown; findings below may reference images not displayed]

FINDINGS: Cardiopericardial silhouette within normal limits. Mediastinal
contours normal. Trachea midline. No airspace disease or effusion.
Mild right basilar atelectasis. Left basilar atelectasis is also
present adjacent to the cardiac apex.
IMPRESSION: No active cardiopulmonary disease.

## 2015-08-04 DIAGNOSIS — M5090 Cervical disc disorder, unspecified, unspecified cervical region: Secondary | ICD-10-CM | POA: Diagnosis not present

## 2015-08-09 DIAGNOSIS — M5090 Cervical disc disorder, unspecified, unspecified cervical region: Secondary | ICD-10-CM | POA: Diagnosis not present

## 2015-08-09 DIAGNOSIS — M542 Cervicalgia: Secondary | ICD-10-CM | POA: Diagnosis not present

## 2015-08-13 DIAGNOSIS — M542 Cervicalgia: Secondary | ICD-10-CM | POA: Diagnosis not present

## 2015-08-13 DIAGNOSIS — M5090 Cervical disc disorder, unspecified, unspecified cervical region: Secondary | ICD-10-CM | POA: Diagnosis not present

## 2015-08-14 DIAGNOSIS — L905 Scar conditions and fibrosis of skin: Secondary | ICD-10-CM | POA: Diagnosis not present

## 2015-08-14 DIAGNOSIS — D225 Melanocytic nevi of trunk: Secondary | ICD-10-CM | POA: Diagnosis not present

## 2015-08-14 DIAGNOSIS — L814 Other melanin hyperpigmentation: Secondary | ICD-10-CM | POA: Diagnosis not present

## 2015-08-14 DIAGNOSIS — D692 Other nonthrombocytopenic purpura: Secondary | ICD-10-CM | POA: Diagnosis not present

## 2015-08-14 DIAGNOSIS — D485 Neoplasm of uncertain behavior of skin: Secondary | ICD-10-CM | POA: Diagnosis not present

## 2015-08-14 DIAGNOSIS — D1801 Hemangioma of skin and subcutaneous tissue: Secondary | ICD-10-CM | POA: Diagnosis not present

## 2015-08-14 DIAGNOSIS — L821 Other seborrheic keratosis: Secondary | ICD-10-CM | POA: Diagnosis not present

## 2015-08-15 DIAGNOSIS — L821 Other seborrheic keratosis: Secondary | ICD-10-CM | POA: Diagnosis not present

## 2015-08-20 DIAGNOSIS — M542 Cervicalgia: Secondary | ICD-10-CM | POA: Diagnosis not present

## 2015-08-20 DIAGNOSIS — M5090 Cervical disc disorder, unspecified, unspecified cervical region: Secondary | ICD-10-CM | POA: Diagnosis not present

## 2015-08-22 DIAGNOSIS — M542 Cervicalgia: Secondary | ICD-10-CM | POA: Diagnosis not present

## 2015-08-22 DIAGNOSIS — M5090 Cervical disc disorder, unspecified, unspecified cervical region: Secondary | ICD-10-CM | POA: Diagnosis not present

## 2015-08-27 DIAGNOSIS — M542 Cervicalgia: Secondary | ICD-10-CM | POA: Diagnosis not present

## 2015-08-27 DIAGNOSIS — M5090 Cervical disc disorder, unspecified, unspecified cervical region: Secondary | ICD-10-CM | POA: Diagnosis not present

## 2015-08-28 DIAGNOSIS — M542 Cervicalgia: Secondary | ICD-10-CM | POA: Diagnosis not present

## 2015-08-28 DIAGNOSIS — M5090 Cervical disc disorder, unspecified, unspecified cervical region: Secondary | ICD-10-CM | POA: Diagnosis not present

## 2015-09-03 DIAGNOSIS — M542 Cervicalgia: Secondary | ICD-10-CM | POA: Diagnosis not present

## 2015-09-03 DIAGNOSIS — M5090 Cervical disc disorder, unspecified, unspecified cervical region: Secondary | ICD-10-CM | POA: Diagnosis not present

## 2015-09-05 DIAGNOSIS — M5090 Cervical disc disorder, unspecified, unspecified cervical region: Secondary | ICD-10-CM | POA: Diagnosis not present

## 2015-09-05 DIAGNOSIS — M542 Cervicalgia: Secondary | ICD-10-CM | POA: Diagnosis not present

## 2015-10-03 DIAGNOSIS — Z23 Encounter for immunization: Secondary | ICD-10-CM | POA: Diagnosis not present

## 2015-10-30 DIAGNOSIS — M7989 Other specified soft tissue disorders: Secondary | ICD-10-CM | POA: Diagnosis not present

## 2016-05-11 DIAGNOSIS — J111 Influenza due to unidentified influenza virus with other respiratory manifestations: Secondary | ICD-10-CM | POA: Diagnosis not present

## 2016-05-20 DIAGNOSIS — J111 Influenza due to unidentified influenza virus with other respiratory manifestations: Secondary | ICD-10-CM | POA: Diagnosis not present

## 2016-05-20 DIAGNOSIS — R0600 Dyspnea, unspecified: Secondary | ICD-10-CM | POA: Diagnosis not present

## 2016-05-20 DIAGNOSIS — Z1389 Encounter for screening for other disorder: Secondary | ICD-10-CM | POA: Diagnosis not present

## 2016-05-20 DIAGNOSIS — I10 Essential (primary) hypertension: Secondary | ICD-10-CM | POA: Diagnosis not present

## 2016-05-20 DIAGNOSIS — M542 Cervicalgia: Secondary | ICD-10-CM | POA: Diagnosis not present

## 2016-05-27 DIAGNOSIS — G933 Postviral fatigue syndrome: Secondary | ICD-10-CM | POA: Diagnosis not present

## 2016-06-03 DIAGNOSIS — Z1211 Encounter for screening for malignant neoplasm of colon: Secondary | ICD-10-CM | POA: Diagnosis not present

## 2016-06-23 DIAGNOSIS — D175 Benign lipomatous neoplasm of intra-abdominal organs: Secondary | ICD-10-CM | POA: Diagnosis not present

## 2016-06-23 DIAGNOSIS — D122 Benign neoplasm of ascending colon: Secondary | ICD-10-CM | POA: Diagnosis not present

## 2016-06-23 DIAGNOSIS — K5730 Diverticulosis of large intestine without perforation or abscess without bleeding: Secondary | ICD-10-CM | POA: Diagnosis not present

## 2016-06-23 DIAGNOSIS — D120 Benign neoplasm of cecum: Secondary | ICD-10-CM | POA: Diagnosis not present

## 2016-06-23 DIAGNOSIS — Z79899 Other long term (current) drug therapy: Secondary | ICD-10-CM | POA: Diagnosis not present

## 2016-06-23 DIAGNOSIS — Z1211 Encounter for screening for malignant neoplasm of colon: Secondary | ICD-10-CM | POA: Diagnosis not present

## 2016-06-23 DIAGNOSIS — D125 Benign neoplasm of sigmoid colon: Secondary | ICD-10-CM | POA: Diagnosis not present

## 2016-06-23 DIAGNOSIS — I10 Essential (primary) hypertension: Secondary | ICD-10-CM | POA: Diagnosis not present

## 2016-06-23 DIAGNOSIS — K219 Gastro-esophageal reflux disease without esophagitis: Secondary | ICD-10-CM | POA: Diagnosis not present

## 2016-06-23 DIAGNOSIS — Q438 Other specified congenital malformations of intestine: Secondary | ICD-10-CM | POA: Diagnosis not present

## 2016-09-10 DIAGNOSIS — D1801 Hemangioma of skin and subcutaneous tissue: Secondary | ICD-10-CM | POA: Diagnosis not present

## 2016-09-10 DIAGNOSIS — L821 Other seborrheic keratosis: Secondary | ICD-10-CM | POA: Diagnosis not present

## 2016-09-10 DIAGNOSIS — L814 Other melanin hyperpigmentation: Secondary | ICD-10-CM | POA: Diagnosis not present

## 2016-09-10 DIAGNOSIS — D225 Melanocytic nevi of trunk: Secondary | ICD-10-CM | POA: Diagnosis not present

## 2016-09-10 DIAGNOSIS — Z85828 Personal history of other malignant neoplasm of skin: Secondary | ICD-10-CM | POA: Diagnosis not present

## 2016-10-14 DIAGNOSIS — M7702 Medial epicondylitis, left elbow: Secondary | ICD-10-CM | POA: Diagnosis not present

## 2016-11-03 DIAGNOSIS — M531 Cervicobrachial syndrome: Secondary | ICD-10-CM | POA: Diagnosis not present

## 2016-11-03 DIAGNOSIS — M9901 Segmental and somatic dysfunction of cervical region: Secondary | ICD-10-CM | POA: Diagnosis not present

## 2016-11-03 DIAGNOSIS — M9902 Segmental and somatic dysfunction of thoracic region: Secondary | ICD-10-CM | POA: Diagnosis not present

## 2017-02-06 DIAGNOSIS — Z23 Encounter for immunization: Secondary | ICD-10-CM | POA: Diagnosis not present

## 2017-02-10 DIAGNOSIS — Z23 Encounter for immunization: Secondary | ICD-10-CM | POA: Diagnosis not present

## 2017-02-24 DIAGNOSIS — R7309 Other abnormal glucose: Secondary | ICD-10-CM | POA: Diagnosis not present

## 2017-02-24 DIAGNOSIS — Z9181 History of falling: Secondary | ICD-10-CM | POA: Diagnosis not present

## 2017-02-24 DIAGNOSIS — I10 Essential (primary) hypertension: Secondary | ICD-10-CM | POA: Diagnosis not present

## 2017-02-24 DIAGNOSIS — Z0000 Encounter for general adult medical examination without abnormal findings: Secondary | ICD-10-CM | POA: Diagnosis not present

## 2017-02-24 DIAGNOSIS — Z125 Encounter for screening for malignant neoplasm of prostate: Secondary | ICD-10-CM | POA: Diagnosis not present

## 2017-02-24 DIAGNOSIS — Z1331 Encounter for screening for depression: Secondary | ICD-10-CM | POA: Diagnosis not present

## 2017-02-24 DIAGNOSIS — Z6829 Body mass index (BMI) 29.0-29.9, adult: Secondary | ICD-10-CM | POA: Diagnosis not present

## 2017-03-12 DIAGNOSIS — J40 Bronchitis, not specified as acute or chronic: Secondary | ICD-10-CM | POA: Diagnosis not present

## 2017-03-12 DIAGNOSIS — J329 Chronic sinusitis, unspecified: Secondary | ICD-10-CM | POA: Diagnosis not present

## 2017-03-12 DIAGNOSIS — Z6829 Body mass index (BMI) 29.0-29.9, adult: Secondary | ICD-10-CM | POA: Diagnosis not present

## 2017-05-22 DIAGNOSIS — Z6828 Body mass index (BMI) 28.0-28.9, adult: Secondary | ICD-10-CM | POA: Diagnosis not present

## 2017-05-22 DIAGNOSIS — J40 Bronchitis, not specified as acute or chronic: Secondary | ICD-10-CM | POA: Diagnosis not present

## 2017-05-22 DIAGNOSIS — J329 Chronic sinusitis, unspecified: Secondary | ICD-10-CM | POA: Diagnosis not present

## 2017-06-25 DIAGNOSIS — R6889 Other general symptoms and signs: Secondary | ICD-10-CM | POA: Diagnosis not present

## 2017-06-25 DIAGNOSIS — Z6827 Body mass index (BMI) 27.0-27.9, adult: Secondary | ICD-10-CM | POA: Diagnosis not present

## 2017-06-25 DIAGNOSIS — J209 Acute bronchitis, unspecified: Secondary | ICD-10-CM | POA: Diagnosis not present

## 2017-07-02 DIAGNOSIS — Z6827 Body mass index (BMI) 27.0-27.9, adult: Secondary | ICD-10-CM | POA: Diagnosis not present

## 2017-07-02 DIAGNOSIS — J209 Acute bronchitis, unspecified: Secondary | ICD-10-CM | POA: Diagnosis not present

## 2017-08-31 DIAGNOSIS — J209 Acute bronchitis, unspecified: Secondary | ICD-10-CM | POA: Diagnosis not present

## 2017-08-31 DIAGNOSIS — J069 Acute upper respiratory infection, unspecified: Secondary | ICD-10-CM | POA: Diagnosis not present

## 2017-10-07 DIAGNOSIS — D485 Neoplasm of uncertain behavior of skin: Secondary | ICD-10-CM | POA: Diagnosis not present

## 2017-10-07 DIAGNOSIS — L814 Other melanin hyperpigmentation: Secondary | ICD-10-CM | POA: Diagnosis not present

## 2017-10-07 DIAGNOSIS — D225 Melanocytic nevi of trunk: Secondary | ICD-10-CM | POA: Diagnosis not present

## 2017-10-07 DIAGNOSIS — Z85828 Personal history of other malignant neoplasm of skin: Secondary | ICD-10-CM | POA: Diagnosis not present

## 2017-10-07 DIAGNOSIS — D1801 Hemangioma of skin and subcutaneous tissue: Secondary | ICD-10-CM | POA: Diagnosis not present

## 2017-10-07 DIAGNOSIS — L821 Other seborrheic keratosis: Secondary | ICD-10-CM | POA: Diagnosis not present

## 2017-10-07 DIAGNOSIS — R234 Changes in skin texture: Secondary | ICD-10-CM | POA: Diagnosis not present

## 2017-10-07 DIAGNOSIS — D2261 Melanocytic nevi of right upper limb, including shoulder: Secondary | ICD-10-CM | POA: Diagnosis not present

## 2017-10-12 DIAGNOSIS — D2261 Melanocytic nevi of right upper limb, including shoulder: Secondary | ICD-10-CM | POA: Diagnosis not present

## 2017-10-12 DIAGNOSIS — D485 Neoplasm of uncertain behavior of skin: Secondary | ICD-10-CM | POA: Diagnosis not present

## 2017-11-20 DIAGNOSIS — R0982 Postnasal drip: Secondary | ICD-10-CM | POA: Diagnosis not present

## 2017-11-20 DIAGNOSIS — J209 Acute bronchitis, unspecified: Secondary | ICD-10-CM | POA: Diagnosis not present

## 2017-11-20 DIAGNOSIS — J309 Allergic rhinitis, unspecified: Secondary | ICD-10-CM | POA: Diagnosis not present

## 2017-11-20 DIAGNOSIS — Z1339 Encounter for screening examination for other mental health and behavioral disorders: Secondary | ICD-10-CM | POA: Diagnosis not present

## 2017-12-29 DIAGNOSIS — Z9089 Acquired absence of other organs: Secondary | ICD-10-CM | POA: Diagnosis not present

## 2017-12-29 DIAGNOSIS — M5136 Other intervertebral disc degeneration, lumbar region: Secondary | ICD-10-CM | POA: Diagnosis not present

## 2017-12-29 DIAGNOSIS — Z6829 Body mass index (BMI) 29.0-29.9, adult: Secondary | ICD-10-CM | POA: Diagnosis not present

## 2017-12-29 DIAGNOSIS — J4520 Mild intermittent asthma, uncomplicated: Secondary | ICD-10-CM | POA: Diagnosis not present

## 2017-12-31 DIAGNOSIS — M961 Postlaminectomy syndrome, not elsewhere classified: Secondary | ICD-10-CM | POA: Diagnosis not present

## 2017-12-31 DIAGNOSIS — M5136 Other intervertebral disc degeneration, lumbar region: Secondary | ICD-10-CM | POA: Diagnosis not present

## 2018-01-26 DIAGNOSIS — M545 Low back pain: Secondary | ICD-10-CM | POA: Diagnosis not present

## 2018-01-26 DIAGNOSIS — M5136 Other intervertebral disc degeneration, lumbar region: Secondary | ICD-10-CM | POA: Diagnosis not present

## 2018-01-26 DIAGNOSIS — M4186 Other forms of scoliosis, lumbar region: Secondary | ICD-10-CM | POA: Diagnosis not present

## 2018-01-28 DIAGNOSIS — M961 Postlaminectomy syndrome, not elsewhere classified: Secondary | ICD-10-CM | POA: Diagnosis not present

## 2018-01-28 DIAGNOSIS — M5136 Other intervertebral disc degeneration, lumbar region: Secondary | ICD-10-CM | POA: Diagnosis not present

## 2018-02-04 DIAGNOSIS — M545 Low back pain: Secondary | ICD-10-CM | POA: Diagnosis not present

## 2018-02-09 DIAGNOSIS — M961 Postlaminectomy syndrome, not elsewhere classified: Secondary | ICD-10-CM | POA: Diagnosis not present

## 2018-02-10 DIAGNOSIS — M961 Postlaminectomy syndrome, not elsewhere classified: Secondary | ICD-10-CM | POA: Diagnosis not present

## 2018-02-10 DIAGNOSIS — M47896 Other spondylosis, lumbar region: Secondary | ICD-10-CM | POA: Diagnosis not present

## 2018-02-12 DIAGNOSIS — M545 Low back pain: Secondary | ICD-10-CM | POA: Diagnosis not present

## 2018-02-17 DIAGNOSIS — M545 Low back pain: Secondary | ICD-10-CM | POA: Diagnosis not present

## 2018-02-19 DIAGNOSIS — M545 Low back pain: Secondary | ICD-10-CM | POA: Diagnosis not present

## 2018-02-22 DIAGNOSIS — M545 Low back pain: Secondary | ICD-10-CM | POA: Diagnosis not present

## 2018-02-24 DIAGNOSIS — M545 Low back pain: Secondary | ICD-10-CM | POA: Diagnosis not present

## 2018-02-26 DIAGNOSIS — M47816 Spondylosis without myelopathy or radiculopathy, lumbar region: Secondary | ICD-10-CM | POA: Diagnosis not present

## 2018-03-15 DIAGNOSIS — M545 Low back pain: Secondary | ICD-10-CM | POA: Diagnosis not present

## 2018-03-17 DIAGNOSIS — M545 Low back pain: Secondary | ICD-10-CM | POA: Diagnosis not present

## 2018-05-12 DIAGNOSIS — L309 Dermatitis, unspecified: Secondary | ICD-10-CM | POA: Diagnosis not present

## 2018-05-12 DIAGNOSIS — L570 Actinic keratosis: Secondary | ICD-10-CM | POA: Diagnosis not present

## 2018-05-12 DIAGNOSIS — L219 Seborrheic dermatitis, unspecified: Secondary | ICD-10-CM | POA: Diagnosis not present

## 2018-05-12 DIAGNOSIS — Z23 Encounter for immunization: Secondary | ICD-10-CM | POA: Diagnosis not present

## 2018-05-20 DIAGNOSIS — Z6830 Body mass index (BMI) 30.0-30.9, adult: Secondary | ICD-10-CM | POA: Diagnosis not present

## 2018-05-20 DIAGNOSIS — J069 Acute upper respiratory infection, unspecified: Secondary | ICD-10-CM | POA: Diagnosis not present

## 2018-05-20 DIAGNOSIS — R6883 Chills (without fever): Secondary | ICD-10-CM | POA: Diagnosis not present

## 2018-05-25 DIAGNOSIS — Z6829 Body mass index (BMI) 29.0-29.9, adult: Secondary | ICD-10-CM | POA: Diagnosis not present

## 2018-05-25 DIAGNOSIS — J209 Acute bronchitis, unspecified: Secondary | ICD-10-CM | POA: Diagnosis not present

## 2018-06-29 DIAGNOSIS — I10 Essential (primary) hypertension: Secondary | ICD-10-CM | POA: Diagnosis not present

## 2018-06-29 DIAGNOSIS — Z125 Encounter for screening for malignant neoplasm of prostate: Secondary | ICD-10-CM | POA: Diagnosis not present

## 2018-06-29 DIAGNOSIS — Z79899 Other long term (current) drug therapy: Secondary | ICD-10-CM | POA: Diagnosis not present

## 2018-07-01 DIAGNOSIS — Z0000 Encounter for general adult medical examination without abnormal findings: Secondary | ICD-10-CM | POA: Diagnosis not present

## 2018-07-01 DIAGNOSIS — I10 Essential (primary) hypertension: Secondary | ICD-10-CM | POA: Diagnosis not present

## 2018-07-01 DIAGNOSIS — G8929 Other chronic pain: Secondary | ICD-10-CM | POA: Diagnosis not present

## 2018-07-01 DIAGNOSIS — M545 Low back pain: Secondary | ICD-10-CM | POA: Diagnosis not present

## 2018-07-01 DIAGNOSIS — E782 Mixed hyperlipidemia: Secondary | ICD-10-CM | POA: Diagnosis not present

## 2018-08-31 DIAGNOSIS — Z6828 Body mass index (BMI) 28.0-28.9, adult: Secondary | ICD-10-CM | POA: Diagnosis not present

## 2018-08-31 DIAGNOSIS — L03114 Cellulitis of left upper limb: Secondary | ICD-10-CM | POA: Diagnosis not present

## 2018-08-31 DIAGNOSIS — S6992XA Unspecified injury of left wrist, hand and finger(s), initial encounter: Secondary | ICD-10-CM | POA: Diagnosis not present

## 2018-08-31 DIAGNOSIS — W5503XA Scratched by cat, initial encounter: Secondary | ICD-10-CM | POA: Diagnosis not present

## 2018-09-03 DIAGNOSIS — L03114 Cellulitis of left upper limb: Secondary | ICD-10-CM | POA: Diagnosis not present

## 2018-09-03 DIAGNOSIS — S62661A Nondisplaced fracture of distal phalanx of left index finger, initial encounter for closed fracture: Secondary | ICD-10-CM | POA: Diagnosis not present

## 2018-09-21 DIAGNOSIS — Z6828 Body mass index (BMI) 28.0-28.9, adult: Secondary | ICD-10-CM | POA: Diagnosis not present

## 2018-09-21 DIAGNOSIS — S62661A Nondisplaced fracture of distal phalanx of left index finger, initial encounter for closed fracture: Secondary | ICD-10-CM | POA: Diagnosis not present

## 2018-10-07 DIAGNOSIS — M7551 Bursitis of right shoulder: Secondary | ICD-10-CM | POA: Diagnosis not present

## 2018-11-24 DIAGNOSIS — M542 Cervicalgia: Secondary | ICD-10-CM | POA: Diagnosis not present

## 2018-12-07 DIAGNOSIS — L309 Dermatitis, unspecified: Secondary | ICD-10-CM | POA: Diagnosis not present

## 2018-12-07 DIAGNOSIS — L814 Other melanin hyperpigmentation: Secondary | ICD-10-CM | POA: Diagnosis not present

## 2018-12-07 DIAGNOSIS — D225 Melanocytic nevi of trunk: Secondary | ICD-10-CM | POA: Diagnosis not present

## 2018-12-07 DIAGNOSIS — Z85828 Personal history of other malignant neoplasm of skin: Secondary | ICD-10-CM | POA: Diagnosis not present

## 2018-12-07 DIAGNOSIS — L820 Inflamed seborrheic keratosis: Secondary | ICD-10-CM | POA: Diagnosis not present

## 2018-12-07 DIAGNOSIS — L821 Other seborrheic keratosis: Secondary | ICD-10-CM | POA: Diagnosis not present

## 2018-12-07 DIAGNOSIS — L219 Seborrheic dermatitis, unspecified: Secondary | ICD-10-CM | POA: Diagnosis not present

## 2019-01-04 DIAGNOSIS — M47812 Spondylosis without myelopathy or radiculopathy, cervical region: Secondary | ICD-10-CM | POA: Diagnosis not present

## 2019-02-03 DIAGNOSIS — M5136 Other intervertebral disc degeneration, lumbar region: Secondary | ICD-10-CM | POA: Diagnosis not present

## 2019-02-03 DIAGNOSIS — M961 Postlaminectomy syndrome, not elsewhere classified: Secondary | ICD-10-CM | POA: Diagnosis not present

## 2019-04-12 DIAGNOSIS — M19042 Primary osteoarthritis, left hand: Secondary | ICD-10-CM | POA: Diagnosis not present

## 2019-04-12 DIAGNOSIS — M542 Cervicalgia: Secondary | ICD-10-CM | POA: Diagnosis not present

## 2019-04-12 DIAGNOSIS — M778 Other enthesopathies, not elsewhere classified: Secondary | ICD-10-CM | POA: Diagnosis not present

## 2019-04-12 DIAGNOSIS — Z23 Encounter for immunization: Secondary | ICD-10-CM | POA: Diagnosis not present

## 2019-04-12 DIAGNOSIS — M19041 Primary osteoarthritis, right hand: Secondary | ICD-10-CM | POA: Diagnosis not present

## 2019-05-11 DIAGNOSIS — M7522 Bicipital tendinitis, left shoulder: Secondary | ICD-10-CM | POA: Diagnosis not present

## 2019-06-06 ENCOUNTER — Encounter: Payer: Self-pay | Admitting: Gastroenterology

## 2019-06-13 ENCOUNTER — Encounter: Payer: Self-pay | Admitting: Gastroenterology

## 2019-06-22 ENCOUNTER — Telehealth: Payer: Self-pay | Admitting: *Deleted

## 2019-06-22 NOTE — Telephone Encounter (Signed)
Patient was called to see if he is still on Coumadin blood thinner. Message left for the patient to call us back to answer this question before PV date.

## 2019-06-24 ENCOUNTER — Other Ambulatory Visit: Payer: Self-pay | Admitting: *Deleted

## 2019-06-24 NOTE — Telephone Encounter (Signed)
I called pt- Pt has been Off Coumadin since 2015 I removed this drug off his medicine list

## 2019-06-29 ENCOUNTER — Ambulatory Visit (AMBULATORY_SURGERY_CENTER): Payer: Self-pay | Admitting: *Deleted

## 2019-06-29 ENCOUNTER — Other Ambulatory Visit: Payer: Self-pay

## 2019-06-29 VITALS — Temp 96.2°F | Ht 72.0 in | Wt 215.6 lb

## 2019-06-29 DIAGNOSIS — Z86010 Personal history of colonic polyps: Secondary | ICD-10-CM

## 2019-06-29 MED ORDER — SUPREP BOWEL PREP KIT 17.5-3.13-1.6 GM/177ML PO SOLN
ORAL | 0 refills | Status: DC
Start: 2019-06-29 — End: 2019-07-20

## 2019-06-29 NOTE — Progress Notes (Signed)
Pt had second dose of covid vaccine greater than 2 weeks ago- no need for pre-procedure test  Pt is aware that care partner will wait in the car during procedure; if they feel like they will be too hot or cold to wait in the car; they may wait in the 4 th floor lobby. Patient is aware to bring only one care partner. We want them to wear a mask (we do not have any that we can provide them), practice social distancing, and we will check their temperatures when they get here.  I did remind the patient that their care partner needs to stay in the parking lot the entire time and have a cell phone available, we will call them when the pt is ready for discharge. Patient will wear mask into building.   No egg or soy allergy  No home oxygen use   No medications for weight loss taken  emmi information given   No trouble with anesthesia, difficulty with intubation or hx/fam hx of malignant hyperthermia per pt

## 2019-07-04 ENCOUNTER — Telehealth: Payer: Self-pay | Admitting: Gastroenterology

## 2019-07-04 NOTE — Telephone Encounter (Signed)
Switched patient to Miralax prep, patient has been informed. Mailed new instructions to patient.

## 2019-07-20 ENCOUNTER — Other Ambulatory Visit: Payer: Self-pay

## 2019-07-20 ENCOUNTER — Ambulatory Visit (AMBULATORY_SURGERY_CENTER): Payer: Medicare Other | Admitting: Gastroenterology

## 2019-07-20 ENCOUNTER — Encounter: Payer: Self-pay | Admitting: Gastroenterology

## 2019-07-20 VITALS — BP 129/78 | HR 74 | Temp 97.3°F | Resp 24 | Ht 72.0 in | Wt 215.0 lb

## 2019-07-20 DIAGNOSIS — Z86010 Personal history of colonic polyps: Secondary | ICD-10-CM

## 2019-07-20 DIAGNOSIS — I10 Essential (primary) hypertension: Secondary | ICD-10-CM | POA: Diagnosis not present

## 2019-07-20 DIAGNOSIS — E7800 Pure hypercholesterolemia, unspecified: Secondary | ICD-10-CM | POA: Diagnosis not present

## 2019-07-20 DIAGNOSIS — D122 Benign neoplasm of ascending colon: Secondary | ICD-10-CM

## 2019-07-20 DIAGNOSIS — D128 Benign neoplasm of rectum: Secondary | ICD-10-CM | POA: Diagnosis not present

## 2019-07-20 DIAGNOSIS — D123 Benign neoplasm of transverse colon: Secondary | ICD-10-CM

## 2019-07-20 MED ORDER — SODIUM CHLORIDE 0.9 % IV SOLN: 500.0000 mL | Freq: Once | INTRAVENOUS | Status: DC

## 2019-07-20 NOTE — Patient Instructions (Signed)
YOU HAD AN ENDOSCOPIC PROCEDURE TODAY AT THE [facility]:   Refer to the procedure report that was given to you for any specific questions about what was found during the examination.  If the procedure report does not answer your questions, please call your gastroenterologist to clarify.  If you requested that your care partner not be given the details of your procedure findings, then the procedure report has been included in a sealed envelope for you to review at your convenience later.  YOU SHOULD EXPECT: Some feelings of bloating in the abdomen. Passage of more gas than usual.  Walking can help get rid of the air that was put into your GI tract during the procedure and reduce the bloating. If you had a lower endoscopy (such as a colonoscopy or flexible sigmoidoscopy) you may notice spotting of blood in your stool or on the toilet paper. If you underwent a bowel prep for your procedure, you may not have a normal bowel movement for a few days.  Please Note:  You might notice some irritation and congestion in your nose or some drainage.  This is from the oxygen used during your procedure.  There is no need for concern and it should clear up in a day or so.  SYMPTOMS TO REPORT IMMEDIATELY:   Following lower endoscopy (colonoscopy or flexible sigmoidoscopy):  Excessive amounts of blood in the stool  Significant tenderness or worsening of abdominal pains  Swelling of the abdomen that is new, acute  Fever of 100F or higher  For urgent or emergent issues, a gastroenterologist can be reached at any hour by calling [phone]. Do not use MyChart messaging for urgent concerns.    DIET:  We do recommend a small meal at first, but then you may proceed to your regular diet.  Drink plenty of fluids but you should avoid alcoholic beverages for 24 hours.  ACTIVITY:  You should plan to take it easy for the rest of today and you should NOT DRIVE or use heavy machinery until tomorrow (because  of the sedation medicines used during the test).    FOLLOW UP: Our staff will call the number listed on your records 48-72 hours following your procedure to check on you and address any questions or concerns that you may have regarding the information given to you following your procedure. If we do not reach you, we will leave a message.  We will attempt to reach you two times.  During this call, we will ask if you have developed any symptoms of COVID 19. If you develop any symptoms (ie: fever, flu-like symptoms, shortness of breath, cough etc.) before then, please call [phone].  If you test positive for Covid 19 in the 2 weeks post procedure, please call and report this information to us.    If any biopsies were taken you will be contacted by phone or by letter within the next 1-3 weeks.  Please call us at [phone] if you have not heard about the biopsies in 3 weeks.    SIGNATURES/CONFIDENTIALITY: You and/or your care partner have signed paperwork which will be entered into your electronic medical record.  These signatures attest to the fact that that the information above on your After Visit Summary has been reviewed and is understood.  Full responsibility of the confidentiality of this discharge information lies with you and/or your care-partner. 

## 2019-07-20 NOTE — Progress Notes (Signed)
Called to room to assist during endoscopic procedure.  Patient ID and intended procedure confirmed with present staff. Received instructions for my participation in the procedure from the performing physician.  

## 2019-07-20 NOTE — Progress Notes (Signed)
Pt's states no medical or surgical changes since previsit or office visit. 

## 2019-07-20 NOTE — Op Note (Signed)
[place] Patient Name: Jorge Gray Procedure Date: 07/20/2019 10:26 AM MRN: [id number] Endoscopist: Jackquline Denmark , MD Age: 74 Referring MD:  Date of Birth: 07-25-1945 Gender: Male Account #: [id number] Procedure:                Colonoscopy Indications:              High risk colon cancer surveillance: Personal                            history of colonic polyps Medicines:                Monitored Anesthesia Care Procedure:                Pre-Anesthesia Assessment:                           - Prior to the procedure, a History and Physical                            was performed, and patient medications and                            allergies were reviewed. The patient's tolerance of                            previous anesthesia was also reviewed. The risks                            and benefits of the procedure and the sedation                            options and risks were discussed with the patient.                            All questions were answered, and informed consent                            was obtained. Prior Anticoagulants: The patient has                            taken no previous anticoagulant or antiplatelet                            agents. ASA Grade Assessment: II - A patient with                            mild systemic disease. After reviewing the risks                            and benefits, the patient was deemed in                            satisfactory condition to undergo the procedure.  After obtaining informed consent, the colonoscope                            was passed under direct vision. Throughout the                            procedure, the patient's blood pressure, pulse, and                            oxygen saturations were monitored continuously. The                            Colonoscope was introduced through the anus and                            advanced to the the cecum, identified by                             appendiceal orifice and ileocecal valve. The                            colonoscopy was performed without difficulty. The                            patient tolerated the procedure well. The quality                            of the bowel preparation was good. The ileocecal                            valve, appendiceal orifice, and rectum were                            photographed. Scope In: 10:33:24 AM Scope Out: 10:51:55 AM Scope Withdrawal Time: 0 hours 14 minutes 14 seconds  Total Procedure Duration: 0 hours 18 minutes 31 seconds  Findings:                 Three sessile polyps were found in the rectum,                            proximal transverse colon and mid ascending colon.                            The polyps were 4 to 6 mm in size. These polyps                            were removed with a cold snare. Resection and                            retrieval were complete.                           Multiple medium-mouthed diverticula were found in  the sigmoid colon, few in descending colon and                            ascending colon.                           Non-bleeding internal hemorrhoids were found during                            retroflexion. The hemorrhoids were moderate.                           The exam was otherwise without abnormality on                            direct and retroflexion views. Complications:            No immediate complications. Estimated Blood Loss:     Estimated blood loss: none. Impression:               -Small colonic polyps s/p polypectomy.                           -Pancolonic diverticulosis predominantly in the                            sigmoid colon.                           -Otherwise normal colonoscopy. Recommendation:           - Patient has a contact number available for                            emergencies. The signs and symptoms of potential                             delayed complications were discussed with the                            patient. Return to normal activities tomorrow.                            Written discharge instructions were provided to the                            patient.                           - High fiber diet.                           - Continue present medications.                           - Await pathology results.                           - Repeat  colonoscopy for surveillance based on                            pathology results.                           - The findings and recommendations were discussed                            with Jorge Gray. Jackquline Denmark, MD 07/20/2019 11:01:27 AM This report has been signed electronically.

## 2019-07-20 NOTE — Progress Notes (Signed)
Report given to PACU, vss 

## 2019-07-22 ENCOUNTER — Telehealth: Payer: Self-pay

## 2019-07-22 ENCOUNTER — Encounter: Payer: Self-pay | Admitting: Gastroenterology

## 2019-07-22 NOTE — Telephone Encounter (Signed)
  Follow up Call-  Call back number 07/20/2019  Post procedure Call Back phone  # [phone]  Permission to leave phone message Yes  Some recent data might be hidden     Patient questions:  Do you have a fever, pain , or abdominal swelling? No. Pain Score  0 *  Have you tolerated food without any problems? Yes.    Have you been able to return to your normal activities? Yes.    Do you have any questions about your discharge instructions: Diet   No. Medications  No. Follow up visit  No.  Do you have questions or concerns about your Care? No.  Actions: * If pain score is 4 or above: No action needed, pain <4.  1. Have you developed a fever since your procedure? no  2.   Have you had an respiratory symptoms (SOB or cough) since your procedure? no  3.   Have you tested positive for COVID 19 since your procedure no  4.   Have you had any family members/close contacts diagnosed with the COVID 19 since your procedure?  no   If yes to any of these questions please route to Joylene John, RN and Alphonsa Gin, Therapist, sports.

## 2019-09-07 DIAGNOSIS — M25512 Pain in left shoulder: Secondary | ICD-10-CM | POA: Diagnosis not present

## 2019-09-07 DIAGNOSIS — M19012 Primary osteoarthritis, left shoulder: Secondary | ICD-10-CM | POA: Diagnosis not present

## 2019-09-15 DIAGNOSIS — M5136 Other intervertebral disc degeneration, lumbar region: Secondary | ICD-10-CM | POA: Diagnosis not present

## 2019-09-15 DIAGNOSIS — M961 Postlaminectomy syndrome, not elsewhere classified: Secondary | ICD-10-CM | POA: Diagnosis not present

## 2019-12-06 DIAGNOSIS — M19012 Primary osteoarthritis, left shoulder: Secondary | ICD-10-CM | POA: Diagnosis not present

## 2019-12-20 DIAGNOSIS — L578 Other skin changes due to chronic exposure to nonionizing radiation: Secondary | ICD-10-CM | POA: Diagnosis not present

## 2019-12-20 DIAGNOSIS — Z85828 Personal history of other malignant neoplasm of skin: Secondary | ICD-10-CM | POA: Diagnosis not present

## 2019-12-20 DIAGNOSIS — L814 Other melanin hyperpigmentation: Secondary | ICD-10-CM | POA: Diagnosis not present

## 2019-12-20 DIAGNOSIS — D225 Melanocytic nevi of trunk: Secondary | ICD-10-CM | POA: Diagnosis not present

## 2019-12-20 DIAGNOSIS — L821 Other seborrheic keratosis: Secondary | ICD-10-CM | POA: Diagnosis not present

## 2019-12-20 DIAGNOSIS — L570 Actinic keratosis: Secondary | ICD-10-CM | POA: Diagnosis not present

## 2020-01-30 DIAGNOSIS — Z23 Encounter for immunization: Secondary | ICD-10-CM | POA: Diagnosis not present

## 2020-01-31 DIAGNOSIS — M5136 Other intervertebral disc degeneration, lumbar region: Secondary | ICD-10-CM | POA: Diagnosis not present

## 2020-02-27 DIAGNOSIS — Z125 Encounter for screening for malignant neoplasm of prostate: Secondary | ICD-10-CM | POA: Diagnosis not present

## 2020-02-27 DIAGNOSIS — E782 Mixed hyperlipidemia: Secondary | ICD-10-CM | POA: Diagnosis not present

## 2020-02-27 DIAGNOSIS — Z23 Encounter for immunization: Secondary | ICD-10-CM | POA: Diagnosis not present

## 2020-02-27 DIAGNOSIS — Z79899 Other long term (current) drug therapy: Secondary | ICD-10-CM | POA: Diagnosis not present

## 2020-02-27 DIAGNOSIS — M159 Polyosteoarthritis, unspecified: Secondary | ICD-10-CM | POA: Diagnosis not present

## 2020-02-27 DIAGNOSIS — Z0000 Encounter for general adult medical examination without abnormal findings: Secondary | ICD-10-CM | POA: Diagnosis not present

## 2020-02-27 DIAGNOSIS — I10 Essential (primary) hypertension: Secondary | ICD-10-CM | POA: Diagnosis not present

## 2020-02-27 DIAGNOSIS — M19012 Primary osteoarthritis, left shoulder: Secondary | ICD-10-CM | POA: Diagnosis not present

## 2020-02-27 DIAGNOSIS — Z9181 History of falling: Secondary | ICD-10-CM | POA: Diagnosis not present

## 2020-04-04 ENCOUNTER — Other Ambulatory Visit: Payer: Self-pay | Admitting: Orthopedic Surgery

## 2020-04-04 DIAGNOSIS — M19012 Primary osteoarthritis, left shoulder: Secondary | ICD-10-CM

## 2020-04-25 ENCOUNTER — Other Ambulatory Visit: Payer: Self-pay

## 2020-04-25 ENCOUNTER — Ambulatory Visit
Admission: RE | Admit: 2020-04-25 | Discharge: 2020-04-25 | Disposition: A | Discharge status: Home / Self Care | Payer: Medicare Other | Source: Ambulatory Visit | Attending: Orthopedic Surgery | Admitting: Orthopedic Surgery

## 2020-04-25 DIAGNOSIS — M19012 Primary osteoarthritis, left shoulder: Secondary | ICD-10-CM | POA: Diagnosis not present

## 2020-04-25 DIAGNOSIS — M25412 Effusion, left shoulder: Secondary | ICD-10-CM | POA: Diagnosis not present

## 2020-05-14 NOTE — Patient Instructions (Addendum)
DUE TO COVID-19 ONLY ONE VISITOR IS ALLOWED TO COME WITH YOU AND STAY IN THE WAITING ROOM ONLY DURING PRE OP AND PROCEDURE DAY OF SURGERY. THE 1 VISITOR  MAY VISIT WITH YOU AFTER SURGERY IN YOUR PRIVATE ROOM DURING VISITING HOURS ONLY!                Jorge Gray   Your procedure is scheduled on:    Report to [facility] Main  Entrance   Report to admitting at: 7:30 AM     Call this number if you have problems the morning of surgery [phone]    Remember:   NO SOLID FOOD AFTER MIDNIGHT THE NIGHT PRIOR TO SURGERY. NOTHING BY MOUTH EXCEPT CLEAR LIQUIDS UNTIL: 7:00 AM . PLEASE FINISH ENSURE DRINK PER SURGEON ORDER  WHICH NEEDS TO BE COMPLETED AT: 7:00 AM .  CLEAR LIQUID DIET   Foods Allowed                                                                     Foods Excluded  Coffee and tea, regular and decaf                             liquids that you cannot  Plain Jell-O any favor except red or purple                                           see through such as: Fruit ices (not with fruit pulp)                                     milk, soups, orange juice  Iced Popsicles                                    All solid food Carbonated beverages, regular and diet                                    Cranberry, grape and apple juices Sports drinks like Gatorade Lightly seasoned clear broth or consume(fat free) Sugar, honey syrup  Sample Menu Breakfast                                Lunch                                     Supper Cranberry juice                    Beef broth                            Chicken broth Jell-O  Grape juice                           Apple juice Coffee or tea                        Jell-O                                      Popsicle                                                Coffee or tea                        Coffee or tea  _____________________________________________________________________    BRUSH YOUR TEETH MORNING OF SURGERY AND RINSE YOUR MOUTH OUT, NO CHEWING GUM CANDY OR MINTS.    Take these medicines the morning of surgery with A SIP OF WATER: gabapentin.                               You may not have any metal on your body including hair pins and              piercings  Do not wear jewelry, lotions, powders or perfumes, deodorant             Men may shave face and neck.   Do not bring valuables to the hospital. [place].  Contacts, dentures or bridgework may not be worn into surgery.  Leave suitcase in the car. After surgery it may be brought to your room.     Patients discharged the day of surgery will not be allowed to drive home. IF YOU ARE HAVING SURGERY AND GOING HOME THE SAME DAY, YOU MUST HAVE AN ADULT TO DRIVE YOU HOME AND BE WITH YOU FOR 24 HOURS. YOU MAY GO HOME BY TAXI OR UBER OR ORTHERWISE, BUT AN ADULT MUST ACCOMPANY YOU HOME AND STAY WITH YOU FOR 24 HOURS.  Name and phone number of your driver:  Special Instructions: N/A              Please read over the following fact sheets you were given: _____________________________________________________________________        [facility] - Preparing for Surgery Before surgery, you can play an important role.  Because skin is not sterile, your skin needs to be as free of germs as possible.  You can reduce the number of germs on your skin by washing with CHG (chlorahexidine gluconate) soap before surgery.  CHG is an antiseptic cleaner which kills germs and bonds with the skin to continue killing germs even after washing. Please DO NOT use if you have an allergy to CHG or antibacterial soaps.  If your skin becomes reddened/irritated stop using the CHG and inform your nurse when you arrive at Short Stay. Do not shave (including legs and underarms) for at least 48 hours prior to the first CHG shower.  You may shave your face/neck. Please follow these instructions  carefully:  1.  Shower with CHG Soap the night before surgery and the  morning of Surgery.  2.  If you choose to wash your hair, wash your hair first as usual with your  normal  shampoo.  3.  After you shampoo, rinse your hair and body thoroughly to remove the  shampoo.                           4.  Use CHG as you would any other liquid soap.  You can apply chg directly  to the skin and wash                       Gently with a scrungie or clean washcloth.  5.  Apply the CHG Soap to your body ONLY FROM THE NECK DOWN.   Do not use on face/ open                           Wound or open sores. Avoid contact with eyes, ears mouth and genitals (private parts).                       Wash face,  Genitals (private parts) with your normal soap.             6.  Wash thoroughly, paying special attention to the area where your surgery  will be performed.  7.  Thoroughly rinse your body with warm water from the neck down.  8.  DO NOT shower/wash with your normal soap after using and rinsing off  the CHG Soap.                9.  Pat yourself dry with a clean towel.            10.  Wear clean pajamas.            11.  Place clean sheets on your bed the night of your first shower and do not  sleep with pets. Day of Surgery : Do not apply any lotions/deodorants the morning of surgery.  Please wear clean clothes to the hospital/surgery center.  FAILURE TO FOLLOW THESE INSTRUCTIONS MAY RESULT IN THE CANCELLATION OF YOUR SURGERY PATIENT SIGNATURE_________________________________  NURSE SIGNATURE__________________________________  ________________________________________________________________________ [facility]- Preparing for Total Shoulder Arthroplasty    Before surgery, you can play an important role. Because skin is not sterile, your skin needs to be as free of germs as possible. You can reduce the number of germs on your skin by using the following products.  Benzoyl Peroxide Gel o Reduces the number of  germs present on the skin o Applied twice a day to shoulder area starting two days before surgery    ==================================================================  Please follow these instructions carefully:  BENZOYL PEROXIDE 5% GEL  Please do not use if you have an allergy to benzoyl peroxide.   If your skin becomes reddened/irritated stop using the benzoyl peroxide.  Starting two days before surgery, apply as follows: 1. Apply benzoyl peroxide in the morning and at night. Apply after taking a shower. If you are not taking a shower clean entire shoulder front, back, and side along with the armpit with a clean wet washcloth.  2. Place a quarter-sized dollop on your shoulder and rub in thoroughly, making sure to cover the front, back, and side of your shoulder, along with the armpit.   2 days  before ____ AM   ____ PM              1 day before ____ AM   ____ PM                         3. Do this twice a day for two days.  (Last application is the night before surgery, AFTER using the CHG soap as described below).  4. Do NOT apply benzoyl peroxide gel on the day of surgery.   Incentive Spirometer  An incentive spirometer is a tool that can help keep your lungs clear and active. This tool measures how well you are filling your lungs with each breath. Taking long deep breaths may help reverse or decrease the chance of developing breathing (pulmonary) problems (especially infection) following:  A long period of time when you are unable to move or be active. BEFORE THE PROCEDURE   If the spirometer includes an indicator to show your best effort, your nurse or respiratory therapist will set it to a desired goal.  If possible, sit up straight or lean slightly forward. Try not to slouch.  Hold the incentive spirometer in an upright position. INSTRUCTIONS FOR USE  1. Sit on the edge of your bed if possible, or sit up as far as you can in bed or on a chair. 2. Hold the incentive  spirometer in an upright position. 3. Breathe out normally. 4. Place the mouthpiece in your mouth and seal your lips tightly around it. 5. Breathe in slowly and as deeply as possible, raising the piston or the ball toward the top of the column. 6. Hold your breath for 3-5 seconds or for as long as possible. Allow the piston or ball to fall to the bottom of the column. 7. Remove the mouthpiece from your mouth and breathe out normally. 8. Rest for a few seconds and repeat Steps 1 through 7 at least 10 times every 1-2 hours when you are awake. Take your time and take a few normal breaths between deep breaths. 9. The spirometer may include an indicator to show your best effort. Use the indicator as a goal to work toward during each repetition. 10. After each set of 10 deep breaths, practice coughing to be sure your lungs are clear. If you have an incision (the cut made at the time of surgery), support your incision when coughing by placing a pillow or rolled up towels firmly against it. Once you are able to get out of bed, walk around indoors and cough well. You may stop using the incentive spirometer when instructed by your caregiver.  RISKS AND COMPLICATIONS  Take your time so you do not get dizzy or light-headed.  If you are in pain, you may need to take or ask for pain medication before doing incentive spirometry. It is harder to take a deep breath if you are having pain. AFTER USE  Rest and breathe slowly and easily.  It can be helpful to keep track of a log of your progress. Your caregiver can provide you with a simple table to help with this. If you are using the spirometer at home, follow these instructions: [place] IF:   You are having difficultly using the spirometer.  You have trouble using the spirometer as often as instructed.  Your pain medication is not giving enough relief while using the spirometer.  You develop fever of 100.5 F (38.1 C) or higher. SEEK  IMMEDIATE MEDICAL CARE IF:   You cough up bloody sputum that had not been present before.  You develop fever of 102 F (38.9 C) or greater.  You develop worsening pain at or near the incision site. MAKE SURE YOU:   Understand these instructions.  Will watch your condition.  Will get help right away if you are not doing well or get worse. Document Released: 09/01/2006 Document Revised: 07/14/2011 Document Reviewed: 11/02/2006 [facility] Patient Information 2014 [place], [place].   ________________________________________________________________________

## 2020-05-15 ENCOUNTER — Encounter (HOSPITAL_COMMUNITY): Payer: Self-pay

## 2020-05-15 ENCOUNTER — Encounter (HOSPITAL_COMMUNITY)
Admission: RE | Admit: 2020-05-15 | Discharge: 2020-05-15 | Disposition: A | Discharge status: Home / Self Care | Payer: Medicare Other | Source: Ambulatory Visit | Attending: Orthopedic Surgery | Admitting: Orthopedic Surgery

## 2020-05-15 ENCOUNTER — Other Ambulatory Visit (HOSPITAL_COMMUNITY): Payer: Medicare Other

## 2020-05-15 ENCOUNTER — Other Ambulatory Visit: Payer: Self-pay

## 2020-05-15 DIAGNOSIS — Z01818 Encounter for other preprocedural examination: Secondary | ICD-10-CM | POA: Diagnosis not present

## 2020-05-15 HISTORY — DX: Malignant (primary) neoplasm, unspecified: C80.1

## 2020-05-15 LAB — BASIC METABOLIC PANEL
Anion gap: 8 (ref 5–15)
BUN: 27 mg/dL — ABNORMAL HIGH (ref 8–23)
CO2: 27 mmol/L (ref 22–32)
Calcium: 9.6 mg/dL (ref 8.9–10.3)
Chloride: 102 mmol/L (ref 98–111)
Creatinine, Ser: 0.85 mg/dL (ref 0.61–1.24)
GFR, Estimated: >60 mL/min (ref >60)
Glucose, Bld: 97 mg/dL (ref 70–99)
Potassium: 4.5 mmol/L (ref 3.5–5.1)
Sodium: 137 mmol/L (ref 135–145)

## 2020-05-15 LAB — SURGICAL PCR SCREEN
MRSA, PCR: NEGATIVE
Staphylococcus aureus: NEGATIVE

## 2020-05-15 LAB — CBC
HCT: 46.9 % (ref 39.0–52.0)
Hemoglobin: 15.2 g/dL (ref 13.0–17.0)
MCH: 27.3 pg (ref 26.0–34.0)
MCHC: 32.4 g/dL (ref 30.0–36.0)
MCV: 84.2 fL (ref 80.0–100.0)
Platelets: 223 10*3/uL (ref 150–400)
RBC: 5.57 MIL/uL (ref 4.22–5.81)
RDW: 13.7 % (ref 11.5–15.5)
WBC: 8.2 10*3/uL (ref 4.0–10.5)
nRBC: 0 % (ref 0.0–0.2)

## 2020-05-15 NOTE — Progress Notes (Signed)
COVID Vaccine Completed: Yes Date COVID Vaccine completed: 01/30/20 [place] COVID vaccine manufacturer: Pfizer     PCP - Dr. Consuello Closs Cardiologist - No  Chest x-ray -  EKG -  Stress Test -  ECHO -  Cardiac Cath -  Pacemaker/ICD device last checked:  Sleep Study -  CPAP -   Fasting Blood Sugar -  Checks Blood Sugar _____ times a day  Blood Thinner Instructions: Aspirin Instructions: Last Dose:  Anesthesia review: Hx: HTN  Patient denies shortness of breath, fever, cough and chest pain at PAT appointment   Patient verbalized understanding of instructions that were given to them at the PAT appointment. Patient was also instructed that they will need to review over the PAT instructions again at home before surgery.

## 2020-05-15 NOTE — Progress Notes (Signed)
Pt. Got COVID positive results on 05/04/20. Results were showed to Konrad Felix PA-C,copy was place in the chart.

## 2020-05-17 ENCOUNTER — Ambulatory Visit (HOSPITAL_COMMUNITY): Payer: Medicare Other | Admitting: Anesthesiology

## 2020-05-17 ENCOUNTER — Encounter (HOSPITAL_COMMUNITY)
Admission: RE | Disposition: A | Discharge status: Home / Self Care | Payer: Self-pay | Source: Ambulatory Visit | Attending: Orthopedic Surgery

## 2020-05-17 ENCOUNTER — Ambulatory Visit (HOSPITAL_COMMUNITY): Payer: Medicare Other | Admitting: Physician Assistant

## 2020-05-17 ENCOUNTER — Ambulatory Visit (HOSPITAL_COMMUNITY)
Admission: RE | Admit: 2020-05-17 | Discharge: 2020-05-17 | Disposition: A | Discharge status: Home / Self Care | Payer: Medicare Other | Source: Ambulatory Visit | Attending: Orthopedic Surgery | Admitting: Orthopedic Surgery

## 2020-05-17 ENCOUNTER — Encounter (HOSPITAL_COMMUNITY): Payer: Self-pay | Admitting: Orthopedic Surgery

## 2020-05-17 DIAGNOSIS — M19012 Primary osteoarthritis, left shoulder: Secondary | ICD-10-CM | POA: Insufficient documentation

## 2020-05-17 DIAGNOSIS — M25712 Osteophyte, left shoulder: Secondary | ICD-10-CM | POA: Diagnosis not present

## 2020-05-17 DIAGNOSIS — Z87891 Personal history of nicotine dependence: Secondary | ICD-10-CM | POA: Diagnosis not present

## 2020-05-17 DIAGNOSIS — G8918 Other acute postprocedural pain: Secondary | ICD-10-CM | POA: Diagnosis not present

## 2020-05-17 DIAGNOSIS — Z791 Long term (current) use of non-steroidal anti-inflammatories (NSAID): Secondary | ICD-10-CM | POA: Diagnosis not present

## 2020-05-17 DIAGNOSIS — I10 Essential (primary) hypertension: Secondary | ICD-10-CM | POA: Diagnosis not present

## 2020-05-17 DIAGNOSIS — Z79899 Other long term (current) drug therapy: Secondary | ICD-10-CM | POA: Diagnosis not present

## 2020-05-17 DIAGNOSIS — E7800 Pure hypercholesterolemia, unspecified: Secondary | ICD-10-CM | POA: Diagnosis not present

## 2020-05-17 HISTORY — PX: TOTAL SHOULDER ARTHROPLASTY: SHX126

## 2020-05-17 SURGERY — ARTHROPLASTY, SHOULDER, TOTAL
Anesthesia: Regional | Site: Shoulder | Laterality: Left

## 2020-05-17 MED ORDER — BUPIVACAINE LIPOSOME 1.3 % IJ SUSP
INTRAMUSCULAR | Status: DC | PRN
  Administered 2020-05-17: 10 mL via PERINEURAL

## 2020-05-17 MED ORDER — TRANEXAMIC ACID-NACL 1000-0.7 MG/100ML-% IV SOLN
1000.0000 mg | INTRAVENOUS | Status: AC
  Administered 2020-05-17: 1000 mg via INTRAVENOUS
  Filled 2020-05-17: qty 100

## 2020-05-17 MED ORDER — PHENYLEPHRINE HCL-NACL 10-0.9 MG/250ML-% IV SOLN
INTRAVENOUS | Status: DC | PRN
  Administered 2020-05-17: 40 ug/min via INTRAVENOUS

## 2020-05-17 MED ORDER — ORAL CARE MOUTH RINSE: 15.0000 mL | Freq: Once | OROMUCOSAL | Status: AC

## 2020-05-17 MED ORDER — PROPOFOL 10 MG/ML IV BOLUS
INTRAVENOUS | Status: AC
  Filled 2020-05-17: qty 20

## 2020-05-17 MED ORDER — MIDAZOLAM HCL 2 MG/2ML IJ SOLN
1.0000 mg | Freq: Once | INTRAMUSCULAR | Status: DC
  Filled 2020-05-17: qty 2

## 2020-05-17 MED ORDER — PHENYLEPHRINE 40 MCG/ML (10ML) SYRINGE FOR IV PUSH (FOR BLOOD PRESSURE SUPPORT)
PREFILLED_SYRINGE | INTRAVENOUS | Status: DC | PRN
  Administered 2020-05-17: 40 ug via INTRAVENOUS

## 2020-05-17 MED ORDER — MELOXICAM 7.5 MG PO TABS: 7.5000 mg | ORAL_TABLET | Freq: Two times a day (BID) | ORAL | 1 refills | Status: DC

## 2020-05-17 MED ORDER — FENTANYL CITRATE (PF) 100 MCG/2ML IJ SOLN
50.0000 ug | Freq: Once | INTRAMUSCULAR | Status: AC
  Administered 2020-05-17: 100 ug via INTRAVENOUS
  Filled 2020-05-17: qty 2

## 2020-05-17 MED ORDER — VANCOMYCIN HCL POWD
Status: DC | PRN
  Administered 2020-05-17: 1000 mg via TOPICAL

## 2020-05-17 MED ORDER — 0.9 % SODIUM CHLORIDE (POUR BTL) OPTIME
TOPICAL | Status: DC | PRN
  Administered 2020-05-17: 1000 mL

## 2020-05-17 MED ORDER — LACTATED RINGERS IV BOLUS: 500.0000 mL | Freq: Once | INTRAVENOUS | Status: DC

## 2020-05-17 MED ORDER — PROPOFOL 10 MG/ML IV BOLUS
INTRAVENOUS | Status: DC | PRN
  Administered 2020-05-17: 20 mg via INTRAVENOUS
  Administered 2020-05-17: 120 mg via INTRAVENOUS
  Administered 2020-05-17: 30 mg via INTRAVENOUS

## 2020-05-17 MED ORDER — TRAMADOL HCL 50 MG PO TABS: 50.0000 mg | ORAL_TABLET | Freq: Four times a day (QID) | ORAL | 0 refills | Status: DC | PRN

## 2020-05-17 MED ORDER — HYDROMORPHONE HCL 1 MG/ML IJ SOLN: 0.2500 mg | INTRAMUSCULAR | Status: DC | PRN

## 2020-05-17 MED ORDER — CHLORHEXIDINE GLUCONATE 0.12 % MT SOLN
15.0000 mL | Freq: Once | OROMUCOSAL | Status: AC
  Administered 2020-05-17: 15 mL via OROMUCOSAL

## 2020-05-17 MED ORDER — OXYCODONE-ACETAMINOPHEN 5-325 MG PO TABS
1.0000 | ORAL_TABLET | ORAL | 0 refills | Status: DC | PRN
Start: 2020-05-17 — End: 2023-08-15

## 2020-05-17 MED ORDER — ROCURONIUM BROMIDE 10 MG/ML (PF) SYRINGE
PREFILLED_SYRINGE | INTRAVENOUS | Status: DC | PRN
  Administered 2020-05-17: 100 mg via INTRAVENOUS

## 2020-05-17 MED ORDER — ONDANSETRON HCL 4 MG PO TABS: 4.0000 mg | ORAL_TABLET | Freq: Three times a day (TID) | ORAL | 0 refills | Status: DC | PRN

## 2020-05-17 MED ORDER — LACTATED RINGERS IV SOLN: INTRAVENOUS | Status: DC

## 2020-05-17 MED ORDER — DEXAMETHASONE SODIUM PHOSPHATE 10 MG/ML IJ SOLN
INTRAMUSCULAR | Status: DC | PRN
  Administered 2020-05-17: 10 mg via INTRAVENOUS

## 2020-05-17 MED ORDER — FENTANYL CITRATE (PF) 100 MCG/2ML IJ SOLN
INTRAMUSCULAR | Status: DC | PRN
  Administered 2020-05-17: 100 ug via INTRAVENOUS

## 2020-05-17 MED ORDER — LIDOCAINE HCL (PF) 2 % IJ SOLN
INTRAMUSCULAR | Status: AC
  Filled 2020-05-17: qty 5

## 2020-05-17 MED ORDER — LIDOCAINE HCL (CARDIAC) PF 100 MG/5ML IV SOSY
PREFILLED_SYRINGE | INTRAVENOUS | Status: DC | PRN
  Administered 2020-05-17: 40 mg via INTRAVENOUS

## 2020-05-17 MED ORDER — OXYCODONE HCL 5 MG PO TABS: 5.0000 mg | ORAL_TABLET | Freq: Once | ORAL | Status: DC | PRN

## 2020-05-17 MED ORDER — ONDANSETRON HCL 4 MG/2ML IJ SOLN
INTRAMUSCULAR | Status: DC | PRN
  Administered 2020-05-17: 4 mg via INTRAVENOUS

## 2020-05-17 MED ORDER — SUGAMMADEX SODIUM 200 MG/2ML IV SOLN
INTRAVENOUS | Status: DC | PRN
  Administered 2020-05-17: 200 mg via INTRAVENOUS

## 2020-05-17 MED ORDER — DEXAMETHASONE SODIUM PHOSPHATE 10 MG/ML IJ SOLN
INTRAMUSCULAR | Status: AC
  Filled 2020-05-17: qty 1

## 2020-05-17 MED ORDER — ONDANSETRON HCL 4 MG/2ML IJ SOLN: 4.0000 mg | Freq: Once | INTRAMUSCULAR | Status: DC | PRN

## 2020-05-17 MED ORDER — VANCOMYCIN HCL 1000 MG IV SOLR
INTRAVENOUS | Status: AC
  Filled 2020-05-17: qty 1000

## 2020-05-17 MED ORDER — BUPIVACAINE HCL (PF) 0.5 % IJ SOLN
INTRAMUSCULAR | Status: DC | PRN
  Administered 2020-05-17: 20 mL via PERINEURAL

## 2020-05-17 MED ORDER — CYCLOBENZAPRINE HCL 10 MG PO TABS: 10.0000 mg | ORAL_TABLET | Freq: Three times a day (TID) | ORAL | 1 refills | Status: DC | PRN

## 2020-05-17 MED ORDER — CEFAZOLIN SODIUM-DEXTROSE 2-4 GM/100ML-% IV SOLN
2.0000 g | INTRAVENOUS | Status: AC
  Administered 2020-05-17: 2 g via INTRAVENOUS
  Filled 2020-05-17: qty 100

## 2020-05-17 MED ORDER — OXYCODONE HCL 5 MG/5ML PO SOLN: 5.0000 mg | Freq: Once | ORAL | Status: DC | PRN

## 2020-05-17 MED ORDER — LACTATED RINGERS IV BOLUS
250.0000 mL | Freq: Once | INTRAVENOUS | Status: AC
  Administered 2020-05-17: 250 mL via INTRAVENOUS

## 2020-05-17 MED ORDER — ONDANSETRON HCL 4 MG/2ML IJ SOLN
INTRAMUSCULAR | Status: AC
  Filled 2020-05-17: qty 2

## 2020-05-17 MED ORDER — ROCURONIUM BROMIDE 10 MG/ML (PF) SYRINGE
PREFILLED_SYRINGE | INTRAVENOUS | Status: AC
  Filled 2020-05-17: qty 10

## 2020-05-17 MED ORDER — FENTANYL CITRATE (PF) 100 MCG/2ML IJ SOLN
INTRAMUSCULAR | Status: AC
  Filled 2020-05-17: qty 2

## 2020-05-17 MED ORDER — ACETAMINOPHEN 500 MG PO TABS
1000.0000 mg | ORAL_TABLET | Freq: Once | ORAL | Status: AC
  Administered 2020-05-17: 1000 mg via ORAL
  Filled 2020-05-17: qty 2

## 2020-05-17 SURGICAL SUPPLY — 63 items
BAG ZIPLOCK 12X15 (MISCELLANEOUS) ×2 IMPLANT
BIT DRILL 2.0X128 (BIT) ×2 IMPLANT
BLADE SAW SGTL 83.5X18.5 (BLADE) ×2 IMPLANT
BODY TRUNION ECLIPSE 43 SL (Shoulder) ×2 IMPLANT
CEMENT BONE DEPUY (Cement) ×2 IMPLANT
COOLER ICEMAN CLASSIC (MISCELLANEOUS) ×2 IMPLANT
COVER BACK TABLE 60X90IN (DRAPES) ×2 IMPLANT
COVER SURGICAL LIGHT HANDLE (MISCELLANEOUS) ×2 IMPLANT
COVER WAND RF STERILE (DRAPES) IMPLANT
DERMABOND ADVANCED (GAUZE/BANDAGES/DRESSINGS) ×1
DERMABOND ADVANCED .7 DNX12 (GAUZE/BANDAGES/DRESSINGS) ×1 IMPLANT
DRAPE ORTHO SPLIT 77X108 STRL (DRAPES) ×4
DRAPE SHEET LG 3/4 BI-LAMINATE (DRAPES) ×2 IMPLANT
DRAPE SURG 17X11 SM STRL (DRAPES) ×2 IMPLANT
DRAPE SURG ORHT 6 SPLT 77X108 (DRAPES) ×2 IMPLANT
DRAPE U-SHAPE 47X51 STRL (DRAPES) ×2 IMPLANT
DRSG AQUACEL AG ADV 3.5X10 (GAUZE/BANDAGES/DRESSINGS) ×2 IMPLANT
DURAPREP 26ML APPLICATOR (WOUND CARE) ×2 IMPLANT
ELECT BLADE TIP CTD 4 INCH (ELECTRODE) ×2 IMPLANT
ELECT REM PT RETURN 15FT ADLT (MISCELLANEOUS) ×2 IMPLANT
FACESHIELD WRAPAROUND (MASK) ×8 IMPLANT
GLENOID WITH CLEAT MEDIUM (Shoulder) ×2 IMPLANT
GLOVE BIO SURGEON STRL SZ7.5 (GLOVE) ×2 IMPLANT
GLOVE BIO SURGEON STRL SZ8 (GLOVE) ×2 IMPLANT
GLOVE SS BIOGEL STRL SZ 7 (GLOVE) ×1 IMPLANT
GLOVE SS BIOGEL STRL SZ 7.5 (GLOVE) ×1 IMPLANT
GLOVE SUPERSENSE BIOGEL SZ 7 (GLOVE) ×1
GLOVE SUPERSENSE BIOGEL SZ 7.5 (GLOVE) ×1
GOWN STRL REUS W/TWL LRG LVL3 (GOWN DISPOSABLE) ×4 IMPLANT
GUIDE MODEL GLENOID VIP 5D SHO (MISCELLANEOUS) ×2 IMPLANT
HEAD HUM ECLIPSE 45/19 (Shoulder) ×2 IMPLANT
IMPL ECLIPSE SPEEDCAP (Shoulder) ×1 IMPLANT
IMPLANT ECLIPSE SPEEDCAP (Shoulder) ×2 IMPLANT
KIT BASIN OR (CUSTOM PROCEDURE TRAY) ×2 IMPLANT
KIT TURNOVER KIT A (KITS) IMPLANT
MANIFOLD NEPTUNE II (INSTRUMENTS) ×2 IMPLANT
NEEDLE TAPERED W/ NITINOL LOOP (MISCELLANEOUS) IMPLANT
NS IRRIG 1000ML POUR BTL (IV SOLUTION) ×2 IMPLANT
PACK SHOULDER (CUSTOM PROCEDURE TRAY) ×2 IMPLANT
PAD COLD SHLDR WRAP-ON (PAD) ×2 IMPLANT
PIN NITINOL TARGETER 2.8 (PIN) ×2 IMPLANT
PIN SET MODULAR GLENOID SYSTEM (PIN) IMPLANT
PROTECTOR NERVE ULNAR (MISCELLANEOUS) ×2 IMPLANT
RESTRAINT HEAD UNIVERSAL NS (MISCELLANEOUS) ×2 IMPLANT
SCREW MED ECLIPSE 35 (Screw) ×2 IMPLANT
SIZER ECLIPSE CAGE SCREW (ORTHOPEDIC DISPOSABLE SUPPLIES) ×2 IMPLANT
SLING ARM FOAM STRAP LRG (SOFTGOODS) ×2 IMPLANT
SLING ARM FOAM STRAP MED (SOFTGOODS) IMPLANT
SMARTMIX MINI TOWER (MISCELLANEOUS) ×2
SPONGE LAP 18X18 RF (DISPOSABLE) IMPLANT
SUCTION FRAZIER HANDLE 12FR (TUBING) ×1
SUCTION TUBE FRAZIER 12FR DISP (TUBING) ×1 IMPLANT
SUT FIBERWIRE #2 38 T-5 BLUE (SUTURE)
SUT MNCRL AB 3-0 PS2 18 (SUTURE) ×2 IMPLANT
SUT MON AB 2-0 CT1 36 (SUTURE) ×2 IMPLANT
SUT VIC AB 1 CT1 36 (SUTURE) ×2 IMPLANT
SUTURE FIBERWR #2 38 T-5 BLUE (SUTURE) IMPLANT
SUTURE TAPE 1.3 40 TPR END (SUTURE) ×2 IMPLANT
SUTURETAPE 1.3 40 TPR END (SUTURE) ×4
TOWEL OR 17X26 10 PK STRL BLUE (TOWEL DISPOSABLE) ×2 IMPLANT
TOWEL OR NON WOVEN STRL DISP B (DISPOSABLE) ×2 IMPLANT
TOWER SMARTMIX MINI (MISCELLANEOUS) ×1 IMPLANT
WATER STERILE IRR 1000ML POUR (IV SOLUTION) ×4 IMPLANT

## 2020-05-17 NOTE — Op Note (Signed)
05/17/2020  12:04 PM  PATIENT:   Jorge Gray  75 y.o. male  PRE-OPERATIVE DIAGNOSIS:  Left shoulder osteoarthritis  POST-OPERATIVE DIAGNOSIS: Same  PROCEDURE: Left shoulder stemless anatomic arthroplasty utilizing a size 43 trunnion with a medium cage screw, a 45 x 19 humeral head and a medium glenoid  SURGEON:  Ogden Handlin, Metta Clines M.D.  ASSISTANTS: Jenetta Loges PA-C  ANESTHESIA:   General endotracheal and interscalene block with Exparel  EBL: 150 cc  SPECIMEN: None  Drains: None   PATIENT DISPOSITION:  PACU - hemodynamically stable.    PLAN OF CARE: Discharge to home after PACU  Brief history:  Ms. Mehring is a 75 year old gentleman who has had chronic and progressively increasing left shoulder pain related to severe osteoarthritis.  Due to his increasing functional imitations and failure to respond to prolonged attempts at conservative management and was brought to the operating this time for planned left shoulder anatomic arthroplasty.  Preoperatively, I counseled the patient regarding treatment options and risks versus benefits thereof.  Possible surgical complications were all reviewed including potential for bleeding, infection, neurovascular injury, persistent pain, loss of motion, anesthetic complication, failure of the implant, and possible need for additional surgery. They understand and accept and agrees with our planned procedure.   Procedure in detail:  After undergoing routine preop evaluation the patient received prophylactic antibiotics and interscalene block with Exparel was established in the holding area by the anesthesia department.  Patient subsequently brought to the operating room placed supine on the operating table and underwent the smooth induction of a general endotracheal anesthesia.  Placed in the beachchair position and appropriately padded and protected.  The left shoulder girdle region was sterilely prepped and draped in standard  fashion.  Timeout was called.  An anterior deltopectoral approach left was made through a 10 cm incision.  Skin flaps were elevated dissection carried deeply and the deltopectoral interval was developed from proximal to distal with a vein taken laterally.  The upper centimeter half of the pectoralis major tendon distal insertion was divided for exposure.  The conjoined tendon was mobilized and retracted medially.  The long head biceps tendon was then tenodesed to the upper border of the pectoralis major tendon and the proximal segment was unroofed and excised.  As we exposed the bicipital groove it became evident that the subscapularis had been separated from the upper two thirds of the lesser tuberosity and given these findings we elected to perform a subscapularis peel as opposed to a lesser tuberosity osteotomy.  There was good bulk of the subscapularis tendon and once was elevated it was tagged for later repair.  We then divided the capsular attachments from the anterior and infra margins of the humeral neck allowing delivery of the humeral head through the wound.  Extra medullary guide was then used to outline the proposed humeral head resection which was performed with an oscillating saw at approximately 20 degrees of retroversion matching the native retroversion.  Care was taken to protect the rotator cuff superiorly and posteriorly.  Rondure was then used to remove the large osteophytes on the margins of the humeral neck.  We then sized the humeral metaphysis at a 43 and the initial preparation of the metaphysis was completed with the hand coring reamer and a size 40 metal cap was then placed over the cut proximal humeral surface.  We then proceeded with exposure of the glenoid placing appropriate retractors and performing a circumferential labral resection gaining complete visualization of the periphery of  the glenoid.  We then utilized our VIP guide to identify the proper starting point in the center of  the glenoid.  There was difficulty getting the guide to sit completely and so we ultimately placed our guidepin by hand using the reference off of the positioning that the VIP guide had provided.  The glenoid was then reamed with a medium reamer to a stable subchondral bony bed correcting approximately 10 degrees of the preoperative version.  All debris was then carefully removed and rondure was used to remove osteophytes at the margin of the glenoid.  Terminal glenoid preparation was then completed with the central drill as well as superior and inferior drill and slight respectively and the glenoid was then broached the trial showed excellent fit and fixation.  At this point the glenoid was cleaned and dried.  Cement was mixed and then introduced into the superior and inferior peg and slight respectively.  Morselized bone was then placed about the central peg of the glenoid and the gland was then impacted with excellent fixation.  We then returned our attention to the proximal humerus with the size 43 trunnion was centered and impacted and then our medium cage screw was filled with bone from the resected humeral head and the cage screw was then placed obtaining excellent purchase and fixation.  We then performed a series of trial reductions and ultimately felt that the 45 x 19 head gave [place] the best soft tissue balance with approximately 50% translation prostaglandin with good tension and elasticity and centering and stability.  The trial was then removed.  At this point we placed 2 Medial Row anchors for subscap repair and had previously placed a suture through the eyelid on the collar of our trunnion.  Thus we had 3 medial row anchoring points with subscap repair.  Our Morse taper was then cleaned and dried and the final 45 x 19 head was impacted and a final reduction was then performed.  At this point we provisionally placed the subscapularis into the native position allowing [place] to pass our medial row anchors  through the subscapularis tendon and the suture limbs were then passed into 2 lateral anchors in an alternating fashion and these lateral anchors within placed allowing [place] to nicely tension and reapproximate the footprint of the subscapularis to the lesser tuberosity with good stability and fixation much for satisfaction.  At this point the arm easily achieved 30 degrees of external Tatian without excessive tension on the subscap repair.  The rotator interval was repaired with a series of figure-of-eight suture tape sutures.  Final construct was menstrual satisfaction.  Wounds copiously irrigated.  Hemostasis was obtained.  1 g vancomycin powder was then sprayed liberally throughout all of the tissue planes and the deltopectoral interval was then closed with a series of interrupted figure-of-eight #0 Vicryl sutures.  2-0 Vicryl used for the subcu layer and intracuticular 3-0 Monocryl for the skin followed by Dermabond and Aquacel dressing.  Left arm was then placed in a sling and the patient was awakened, extubated, and taken to the recovery room in stable condition.  Jenetta Loges, PA-C was utilized as an Environmental consultant throughout this case, essential for help with positioning the patient, positioning extremity, tissue manipulation, implantation of the prosthesis, suture management, wound closure, and intraoperative decision-making.  Marin Shutter MD   Contact # [phone]

## 2020-05-17 NOTE — Evaluation (Signed)
Occupational Therapy Evaluation Patient Details Name: Jorge Gray MRN: [id number] DOB: 1945-11-21 Today's Date: 05/17/2020    History of Present Illness 75 year old man s/p L TSA.   Clinical Impression   Jorge Gray is a 75 year old man s/p s/p shoulder replacement without functional use of left nondominant upper extremity secondary to effects of surgery, interscalene block and shoulder precautions. Therapist provided education and instruction to patient and spouse in regards to exercises, precautions, positioning, donning upper extremity clothing and bathing while maintaining shoulder precautions, ice and edema management and donning/doffing sling. Patient and spouse verbalized understanding and demonstrated as needed. Patient needed assistance to donn shirt, underwear, pants, socks and shoes by spouse. Therapist provided instruction on compensatory strategies to perform ADLs. Handouts provided to maximize retention of education. Patient to follow up with MD for further therapy needs.      Follow Up Recommendations  Follow surgeon's recommendation for DC plan and follow-up therapies    Equipment Recommendations  None recommended by OT    Recommendations for Other Services       Precautions / Restrictions Precautions Precautions: Shoulder Type of Shoulder Precautions: May come out of sling if sitting in controlled environment. ie while watching tv, eating etc to give neck and skin break from sling. Please sleep in sling though until 4 weeks post op.     Ok to use operative arm to assist in feeding, bathing, ADL's.       New PROM restrictions (8/18) for use in hygiene and ADL only   ER 20   ABD 45   FE 60     Pendulums are to be gentle and are the preferred exercise to be instructed for patients to perform at home.( Along with elbow wrist and hand exercise) Shoulder Interventions: Shoulder sling/immobilizer;At all times;Off for dressing/bathing/exercises Required Braces or  Orthoses: Sling      Mobility Bed Mobility Overal bed mobility: Modified Independent                  Transfers                      Balance Overall balance assessment: No apparent balance deficits (not formally assessed)                                         ADL either performed or assessed with clinical judgement   ADL Overall ADL's : Needs assistance/impaired Eating/Feeding: Independent   Grooming: Independent   Upper Body Bathing: Moderate assistance   Lower Body Bathing: Modified independent   Upper Body Dressing : Modified independent   Lower Body Dressing: Moderate assistance Lower Body Dressing Details (indicate cue type and reason): spouse assisted patient with donning LB clothing. Toilet Transfer: Psychologist, prison and probation services and Hygiene: Modified independent               Vision Baseline Vision/History: No visual deficits Vision Assessment?: No apparent visual deficits     Perception     Praxis      Pertinent Vitals/Pain Pain Assessment: No/denies pain     Hand Dominance     Extremity/Trunk Assessment Upper Extremity Assessment Upper Extremity Assessment: LUE deficits/detail LUE Deficits / Details: Non functional use of left UE secondary to interscalene block.   Lower Extremity Assessment Lower Extremity Assessment: Overall WFL for tasks assessed   Cervical /  Trunk Assessment Cervical / Trunk Assessment: Normal   Communication     Cognition Arousal/Alertness: Awake/alert Behavior During Therapy: WFL for tasks assessed/performed Overall Cognitive Status: Within Functional Limits for tasks assessed                                     General Comments       Exercises     Shoulder Instructions Shoulder Instructions Donning/doffing shirt without moving shoulder: Caregiver independent with task Method for sponge bathing under operated UE:  Independent Donning/doffing sling/immobilizer: Caregiver independent with task Correct positioning of sling/immobilizer: Independent Pendulum exercises (written home exercise program): Independent ROM for elbow, wrist and digits of operated UE: Independent Sling wearing schedule (on at all times/off for ADL's): Independent Proper positioning of operated UE when showering: Independent Dressing change: Independent Positioning of UE while sleeping: [place]                                          Prior Functioning/Environment                   OT Problem List: Decreased strength;Decreased range of motion;Pain;Impaired UE functional use      OT Treatment/Interventions:      OT Goals(Current goals can be found in the care plan section) Acute Rehab OT Goals OT Goal Formulation: All assessment and education complete, DC therapy  OT Frequency:     Barriers to D/C:            Co-evaluation              AM-PAC OT "6 Clicks" Daily Activity     Outcome Measure Help from another person eating meals?: None Help from another person taking care of personal grooming?: None Help from another person toileting, which includes using toliet, bedpan, or urinal?: None Help from another person bathing (including washing, rinsing, drying)?: A Little Help from another person to put on and taking off regular upper body clothing?: A Little Help from another person to put on and taking off regular lower body clothing?: A Lot 6 Click Score: 20   End of Session Nurse Communication:  (OT education complete)  Activity Tolerance: Patient tolerated treatment well Patient left: in chair  OT Visit Diagnosis: Muscle weakness (generalized) (M62.81);Pain                Time: 7035-0093 OT Time Calculation (min): 27 min Charges:  OT General Charges $OT Visit: 1 Visit OT Evaluation $OT Eval Low Complexity: 1 Low OT Treatments $Self Care/Home Management :  8-22 mins  Jorge Gray, OTR/L [place]  Office [phone] Pager: [phone]   Lenward Chancellor 05/17/2020, 2:50 PM

## 2020-05-17 NOTE — Discharge Instructions (Signed)
 Kevin M. Supple, M.D., F.A.A.O.S. Orthopaedic Surgery Specializing in Arthroscopic and Reconstructive Surgery of the Shoulder [phone] [address] - [place], [place] [address] - Fax [phone]   POST-OP TOTAL SHOULDER REPLACEMENT INSTRUCTIONS  1. Follow up in the office for your first post-op appointment 10-14 days from the date of your surgery. If you do not already have a scheduled appointment, our office will contact you to schedule.  2. The bandage over your incision is waterproof. You may begin showering with this dressing on. You may leave this dressing on until first follow up appointment within 2 weeks. We prefer you leave this dressing in place until follow up however after 5-7 days if you are having itching or skin irritation and would like to remove it you may do so. Go slow and tug at the borders gently to break the bond the dressing has with the skin. At this point if there is no drainage it is okay to go without a bandage or you may cover it with a light guaze and tape. You can also expect significant bruising around your shoulder that will drift down your arm and into your chest wall. This is very normal and should resolve over several days.   3. Wear your sling/immobilizer at all times except to perform the exercises below or to occasionally let your arm dangle by your side to stretch your elbow. You also need to sleep in your sling immobilizer until instructed otherwise. It is ok to remove your sling if you are sitting in a controlled environment and allow your arm to rest in a position of comfort by your side or on your lap with pillows to give your neck and skin a break from the sling. You may remove it to allow arm to dangle by side to shower. If you are up walking around and when you go to sleep at night you need to wear it.  4. Range of motion to your elbow, wrist, and hand are encouraged 3-5 times daily. Exercise to your hand and fingers helps to reduce  swelling you may experience.   5. Prescriptions for a pain medication and a muscle relaxant are provided for you. It is recommended that if you are experiencing pain that you pain medication alone is not controlling, add the muscle relaxant along with the pain medication which can give additional pain relief. The first 1-2 days is generally the most severe of your pain and then should gradually decrease. As your pain lessens it is recommended that you decrease your use of the pain medications to an "as needed basis'" only and to always comply with the recommended dosages of the pain medications.  6. Pain medications can produce constipation along with their use. If you experience this, the use of an over the counter stool softener or laxative daily is recommended.   7. For additional questions or concerns, please do not hesitate to call the office. If after hours there is an answering service to forward your concerns to the physician on call.  8.Pain control following an exparel block  To help control your post-operative pain you received a nerve block  performed with Exparel which is a long acting anesthetic (numbing agent) which can provide pain relief and sensations of numbness (and relief of pain) in the operative shoulder and arm for up to 3 days. Sometimes it provides mixed relief, meaning you may still have numbness in certain areas of the arm but can still be able to   move  parts of that arm, hand, and fingers. We recommend that your prescribed pain medications  be used as needed. We do not feel it is necessary to "pre medicate" and "stay ahead" of pain.  Taking narcotic pain medications when you are not having any pain can lead to unnecessary and potentially dangerous side effects.    9. Use the ice machine as much as possible in the first 5-7 days from surgery, then you can wean its use to as needed. The ice typically needs to be replaced every 6 hours, instead of ice you can actually freeze  water bottles to put in the cooler and then fill water around them to avoid having to purchase ice. You can have spare water bottles freezing to allow you to rotate them once they have melted. Try to have a thin shirt or light cloth or towel under the ice wrap to protect your skin.   FOR ADDITIONAL INFO ON ICE MACHINE AND INSTRUCTIONS GO TO THE WEBSITE AT  https://www.djogl[employer].com/products/donjoy/donjoy-iceman-[email or web address]  10.  We recommend that you avoid any dental work or cleaning in the first 3 months following your joint replacement. This is to help minimize the possibility of infection from the bacteria in your mouth that enters your bloodstream during dental work. We also recommend that you take an antibiotic prior to your dental work for the first year after your shoulder replacement to further help reduce that risk. Please simply contact our office for antibiotics to be sent to your pharmacy prior to dental work.  11. Dental Antibiotics:  In most cases prophylactic antibiotics for Dental procdeures after total joint surgery are not necessary.  Exceptions are as follows:  1. History of prior total joint infection  2. Severely immunocompromised (Organ Transplant, cancer chemotherapy, Rheumatoid biologic meds such as Humera)  3. Poorly controlled diabetes (A1C &gt; 8.0, blood glucose over 200)  If you have one of these conditions, contact your surgeon for an antibiotic prescription, prior to your dental procedure.   POST-OP EXERCISES  Pendulum Exercises  Perform pendulum exercises while standing and bending at the waist. Support your uninvolved arm on a table or chair and allow your operated arm to hang freely. Make sure to do these exercises passively - not using you shoulder muscles. These exercises can be performed once your nerve block effects have worn off.  Repeat 20 times. Do 3 sessions per day.     

## 2020-05-17 NOTE — Progress Notes (Signed)
Assisted Dr Beth Finucane with left, ultrasound guided, interscalene  block. Side rails up, monitors on throughout procedure. See vital signs in flow sheet. Tolerated Procedure well.  

## 2020-05-17 NOTE — Anesthesia Preprocedure Evaluation (Addendum)
Anesthesia Evaluation  Patient identified by MRN, date of birth, ID band Patient awake    Reviewed: Allergy & Precautions, NPO status , Patient's Chart, lab work & pertinent test results  Airway Mallampati: II  TM Distance: >3 FB Neck ROM: Full    Dental no notable dental hx. (+) Teeth Intact, Dental Advisory Given   Pulmonary neg shortness of breath, former smoker,  Quit smoking 2000   Pulmonary exam normal breath sounds clear to auscultation       Cardiovascular hypertension, Pt. on medications + DVT (after knee surgery 2015, treated)  Normal cardiovascular exam Rhythm:Regular Rate:Normal  BP 166/79 this AM in preop, per pt has been high recently because they reduced his meds when he lost weight   Neuro/Psych negative neurological ROS  negative psych ROS   GI/Hepatic negative GI ROS, Neg liver ROS,   Endo/Other  negative endocrine ROS  Renal/GU negative Renal ROS  negative genitourinary   Musculoskeletal  (+) Arthritis , Osteoarthritis,  L shoulder OA    Abdominal   Peds  Hematology negative hematology ROS (+) hct 46.9   Anesthesia Other Findings   Reproductive/Obstetrics negative OB ROS                            Anesthesia Physical Anesthesia Plan  ASA: II  Anesthesia Plan: General and Regional   Post-op Pain Management: GA combined w/ Regional for post-op pain   Induction: Intravenous  PONV Risk Score and Plan: Ondansetron, Dexamethasone and Treatment may vary due to age or medical condition  Airway Management Planned: Oral ETT  Additional Equipment: None  Intra-op Plan:   Post-operative Plan: Extubation in OR  Informed Consent: I have reviewed the patients History and Physical, chart, labs and discussed the procedure including the risks, benefits and alternatives for the proposed anesthesia with the patient or authorized representative who has indicated his/her  understanding and acceptance.     Dental advisory given  Plan Discussed with: CRNA  Anesthesia Plan Comments:        Anesthesia Quick Evaluation

## 2020-05-17 NOTE — H&P (Signed)
Jorge Gray    Chief Complaint: Left shoulder osteoarthritis HPI: The patient is a 75 y.o. male with chronic and progressively increasing left shoulder pain related to severe osteoarthritis.  Due to his increasing function limitations and failure to respond to prolonged attempted conservative management he is brought to the operating room at this time for planned left shoulder anatomic arthroplasty.  Past Medical History:  Diagnosis Date  . Asthma    as child  . Cancer ([facility])    basal cells were removed from scalp  . DDD (degenerative disc disease)   . DVT (deep venous thrombosis) ([facility])    DVT 6 weeks after knee surgery- 2015- off Lovenox and Coumadin  . Eczema   . History of nonmelanoma skin cancer    basel cell removed from scalp  . Hypercholesteremia   . Hypertension   . Pinched nerve    right toe- taking Neurontin  . Rash    abd    Past Surgical History:  Procedure Laterality Date  . BACK SURGERY     x 2  . COLONOSCOPY    . NOSE SURGERY    . TOTAL KNEE ARTHROPLASTY Bilateral 07/08/2013   Procedure: TOTAL KNEE BILATERAL;  Surgeon: Gearlean Alf, MD;  Location: [facility];  Service: Orthopedics;  Laterality: Bilateral;  . VASECTOMY    . VASECTOMY REVERSAL      Family History  Problem Relation Age of Onset  . Colon cancer Paternal Grandmother   . Colon cancer Paternal Grandfather   . Esophageal cancer Neg Hx   . Rectal cancer Neg Hx   . Stomach cancer Neg Hx     Social History:  reports that he quit smoking about 21 years ago. He has quit using smokeless tobacco. He reports current alcohol use. He reports that he does not use drugs.   Medications Prior to Admission  Medication Sig Dispense Refill  . diclofenac Sodium (VOLTAREN) 1 % GEL Apply 1 application topically 4 (four) times daily as needed (pain).    Marland Kitchen gabapentin (NEURONTIN) 100 MG capsule Take 200 mg by mouth daily as needed (pain).    Marland Kitchen lisinopril (ZESTRIL) 5 MG tablet Take 5 mg by mouth daily.    .  meloxicam (MOBIC) 7.5 MG tablet Take 7.5 mg by mouth in the morning and at bedtime.    . traMADol (ULTRAM) 50 MG tablet Take 50 mg by mouth 3 (three) times daily as needed for severe pain.       Physical Exam: Left shoulder demonstrates painful and guarded motion as noted at his recent office visits.  He maintains excellent strength to the rotator cuff.  Preoperative CT scan demonstrates severe arthrosis with some mild retroversion.  CT templating has been utilized for assistance with intraoperative implant positioning.  Vitals  Temp:  [98.2 F (36.8 C)] 98.2 F (36.8 C) (01/13 0751) Pulse Rate:  [60-64] 64 (01/13 0923) Resp:  [15-16] 15 (01/13 0923) BP: (149-166)/(77-86) 163/77 (01/13 0923) SpO2:  [97 %-100 %] 97 % (01/13 0923) Weight:  [90.7 kg] 90.7 kg (01/13 0759)  Assessment/Plan  Impression: Left shoulder osteoarthritis  Plan of Action: Procedure(s): LEFT TOTAL SHOULDER ARTHROPLASTY  Axton Cihlar M Amante Fomby 05/17/2020, 9:24 AM Contact # [phone]

## 2020-05-17 NOTE — Anesthesia Procedure Notes (Signed)
Anesthesia Regional Block: Interscalene brachial plexus block   Pre-Anesthetic Checklist: ,, timeout performed, Correct Patient, Correct Site, Correct Laterality, Correct Procedure, Correct Position, site marked, Risks and benefits discussed,  Surgical consent,  Pre-op evaluation,  At surgeon's request and post-op pain management  Laterality: Left  Prep: Maximum Sterile Barrier Precautions used, chloraprep       Needles:  Injection technique: Single-shot  Needle Type: Echogenic Stimulator Needle     Needle Length: 9cm  Needle Gauge: 22     Additional Needles:   Procedures:,,,, ultrasound used (permanent image in chart),,,,  Narrative:  Start time: 05/17/2020 9:05 AM End time: 05/17/2020 9:15 AM Injection made incrementally with aspirations every 5 mL.  Performed by: Personally  Anesthesiologist: Pervis Hocking, DO  Additional Notes: Monitors applied. No increased pain on injection. No increased resistance to injection. Injection made in 5cc increments. Good needle visualization. Patient tolerated procedure well.

## 2020-05-17 NOTE — Anesthesia Procedure Notes (Addendum)
Procedure Name: Intubation Date/Time: 05/17/2020 10:15 AM Performed by: Raenette Rover, CRNA Pre-anesthesia Checklist: Patient identified, Emergency Drugs available, Suction available and Patient being monitored Patient Re-evaluated:Patient Re-evaluated prior to induction Oxygen Delivery Method: Circle system utilized Preoxygenation: Pre-oxygenation with 100% oxygen Induction Type: IV induction Ventilation: Mask ventilation without difficulty and Oral airway inserted - appropriate to patient size Laryngoscope Size: Mac and 4 Grade View: Grade II Tube type: Oral Tube size: 7.5 mm Number of attempts: 1 Airway Equipment and Method: Stylet Placement Confirmation: ETT inserted through vocal cords under direct vision,  breath sounds checked- equal and bilateral and positive ETCO2 Secured at: 22 cm Tube secured with: Tape Dental Injury: Teeth and Oropharynx as per pre-operative assessment

## 2020-05-17 NOTE — Anesthesia Postprocedure Evaluation (Signed)
Anesthesia Post Note  Patient: Gilliam J Wierenga  Procedure(s) Performed: LEFT TOTAL SHOULDER ARTHROPLASTY (Left Shoulder)     Patient location during evaluation: PACU Anesthesia Type: Regional and General Level of consciousness: awake and alert, oriented and patient cooperative Pain management: pain level controlled Vital Signs Assessment: post-procedure vital signs reviewed and stable Respiratory status: spontaneous breathing, nonlabored ventilation and respiratory function stable Cardiovascular status: blood pressure returned to baseline and stable Postop Assessment: no apparent nausea or vomiting Anesthetic complications: no   No complications documented.  Last Vitals:  Vitals:   05/17/20 0923 05/17/20 1215  BP: (!) 163/77 (!) 150/83  Pulse: 64 87  Resp: 15 20  Temp:  36.4 C  SpO2: 97% 96%    Last Pain:  Vitals:   05/17/20 1215  TempSrc:   PainSc: 0-No pain                 Pervis Hocking

## 2020-05-17 NOTE — Transfer of Care (Signed)
Immediate Anesthesia Transfer of Care Note  Patient: Jorge Gray  Procedure(s) Performed: LEFT TOTAL SHOULDER ARTHROPLASTY (Left Shoulder)  Patient Location: PACU  Anesthesia Type:GA combined with regional for post-op pain  Level of Consciousness: awake, drowsy and patient cooperative  Airway & Oxygen Therapy: Patient Spontanous Breathing and Patient connected to face mask oxygen  Post-op Assessment: Report given to RN and Post -op Vital signs reviewed and stable  Post vital signs: Reviewed and stable  Last Vitals:  Vitals Value Taken Time  BP 150/83   Temp    Pulse 82 05/17/20 1216  Resp 16 05/17/20 1216  SpO2 96 % 05/17/20 1216  Vitals shown include unvalidated device data.  Last Pain:  Vitals:   05/17/20 0923  TempSrc:   PainSc: 0-No pain         Complications: No complications documented.

## 2020-05-18 ENCOUNTER — Encounter (HOSPITAL_COMMUNITY): Payer: Self-pay | Admitting: Orthopedic Surgery

## 2020-05-18 MED FILL — Vancomycin HCl For IV Soln 1 GM (Base Equivalent): INTRAVENOUS | Qty: 1000 | Status: AC

## 2020-05-30 DIAGNOSIS — Z96619 Presence of unspecified artificial shoulder joint: Secondary | ICD-10-CM | POA: Diagnosis not present

## 2020-05-30 DIAGNOSIS — Z96612 Presence of left artificial shoulder joint: Secondary | ICD-10-CM | POA: Diagnosis not present

## 2020-05-30 DIAGNOSIS — Z471 Aftercare following joint replacement surgery: Secondary | ICD-10-CM | POA: Diagnosis not present

## 2020-06-12 ENCOUNTER — Other Ambulatory Visit (HOSPITAL_COMMUNITY): Payer: Medicare Other

## 2020-06-12 DIAGNOSIS — M6281 Muscle weakness (generalized): Secondary | ICD-10-CM | POA: Diagnosis not present

## 2020-06-13 DIAGNOSIS — M19042 Primary osteoarthritis, left hand: Secondary | ICD-10-CM | POA: Diagnosis not present

## 2020-06-13 DIAGNOSIS — I10 Essential (primary) hypertension: Secondary | ICD-10-CM | POA: Diagnosis not present

## 2020-06-13 DIAGNOSIS — M19041 Primary osteoarthritis, right hand: Secondary | ICD-10-CM | POA: Diagnosis not present

## 2020-06-13 DIAGNOSIS — Z6827 Body mass index (BMI) 27.0-27.9, adult: Secondary | ICD-10-CM | POA: Diagnosis not present

## 2020-06-14 DIAGNOSIS — M6281 Muscle weakness (generalized): Secondary | ICD-10-CM | POA: Diagnosis not present

## 2020-06-19 DIAGNOSIS — M6281 Muscle weakness (generalized): Secondary | ICD-10-CM | POA: Diagnosis not present

## 2020-06-21 DIAGNOSIS — M6281 Muscle weakness (generalized): Secondary | ICD-10-CM | POA: Diagnosis not present

## 2020-06-26 DIAGNOSIS — M6281 Muscle weakness (generalized): Secondary | ICD-10-CM | POA: Diagnosis not present

## 2020-06-27 DIAGNOSIS — Z471 Aftercare following joint replacement surgery: Secondary | ICD-10-CM | POA: Diagnosis not present

## 2020-06-27 DIAGNOSIS — Z96612 Presence of left artificial shoulder joint: Secondary | ICD-10-CM | POA: Diagnosis not present

## 2020-06-28 DIAGNOSIS — M6281 Muscle weakness (generalized): Secondary | ICD-10-CM | POA: Diagnosis not present

## 2020-07-03 DIAGNOSIS — M6281 Muscle weakness (generalized): Secondary | ICD-10-CM | POA: Diagnosis not present

## 2020-07-05 DIAGNOSIS — M6281 Muscle weakness (generalized): Secondary | ICD-10-CM | POA: Diagnosis not present

## 2020-07-17 DIAGNOSIS — M6281 Muscle weakness (generalized): Secondary | ICD-10-CM | POA: Diagnosis not present

## 2020-07-18 DIAGNOSIS — Z96653 Presence of artificial knee joint, bilateral: Secondary | ICD-10-CM | POA: Diagnosis not present

## 2020-07-19 DIAGNOSIS — M6281 Muscle weakness (generalized): Secondary | ICD-10-CM | POA: Diagnosis not present

## 2020-07-24 DIAGNOSIS — M6281 Muscle weakness (generalized): Secondary | ICD-10-CM | POA: Diagnosis not present

## 2020-07-26 DIAGNOSIS — M6281 Muscle weakness (generalized): Secondary | ICD-10-CM | POA: Diagnosis not present

## 2020-07-31 DIAGNOSIS — M6281 Muscle weakness (generalized): Secondary | ICD-10-CM | POA: Diagnosis not present

## 2020-08-02 DIAGNOSIS — M6281 Muscle weakness (generalized): Secondary | ICD-10-CM | POA: Diagnosis not present

## 2020-08-08 DIAGNOSIS — Z96612 Presence of left artificial shoulder joint: Secondary | ICD-10-CM | POA: Diagnosis not present

## 2020-08-08 DIAGNOSIS — Z471 Aftercare following joint replacement surgery: Secondary | ICD-10-CM | POA: Diagnosis not present

## 2020-09-27 DIAGNOSIS — L570 Actinic keratosis: Secondary | ICD-10-CM | POA: Diagnosis not present

## 2020-12-17 DIAGNOSIS — H25813 Combined forms of age-related cataract, bilateral: Secondary | ICD-10-CM | POA: Diagnosis not present

## 2020-12-25 DIAGNOSIS — L409 Psoriasis, unspecified: Secondary | ICD-10-CM | POA: Diagnosis not present

## 2020-12-25 DIAGNOSIS — Z85828 Personal history of other malignant neoplasm of skin: Secondary | ICD-10-CM | POA: Diagnosis not present

## 2020-12-25 DIAGNOSIS — L820 Inflamed seborrheic keratosis: Secondary | ICD-10-CM | POA: Diagnosis not present

## 2020-12-25 DIAGNOSIS — L814 Other melanin hyperpigmentation: Secondary | ICD-10-CM | POA: Diagnosis not present

## 2020-12-25 DIAGNOSIS — L578 Other skin changes due to chronic exposure to nonionizing radiation: Secondary | ICD-10-CM | POA: Diagnosis not present

## 2020-12-25 DIAGNOSIS — L821 Other seborrheic keratosis: Secondary | ICD-10-CM | POA: Diagnosis not present

## 2020-12-25 DIAGNOSIS — D225 Melanocytic nevi of trunk: Secondary | ICD-10-CM | POA: Diagnosis not present

## 2021-02-14 DIAGNOSIS — M5416 Radiculopathy, lumbar region: Secondary | ICD-10-CM | POA: Diagnosis not present

## 2021-04-18 DIAGNOSIS — Z0000 Encounter for general adult medical examination without abnormal findings: Secondary | ICD-10-CM | POA: Diagnosis not present

## 2021-04-18 DIAGNOSIS — E782 Mixed hyperlipidemia: Secondary | ICD-10-CM | POA: Diagnosis not present

## 2021-04-18 DIAGNOSIS — M159 Polyosteoarthritis, unspecified: Secondary | ICD-10-CM | POA: Diagnosis not present

## 2021-04-18 DIAGNOSIS — Z6827 Body mass index (BMI) 27.0-27.9, adult: Secondary | ICD-10-CM | POA: Diagnosis not present

## 2021-04-18 DIAGNOSIS — Z125 Encounter for screening for malignant neoplasm of prostate: Secondary | ICD-10-CM | POA: Diagnosis not present

## 2021-04-18 DIAGNOSIS — I10 Essential (primary) hypertension: Secondary | ICD-10-CM | POA: Diagnosis not present

## 2021-04-18 DIAGNOSIS — Z79899 Other long term (current) drug therapy: Secondary | ICD-10-CM | POA: Diagnosis not present

## 2021-08-20 DIAGNOSIS — M5416 Radiculopathy, lumbar region: Secondary | ICD-10-CM | POA: Diagnosis not present

## 2021-12-25 DIAGNOSIS — D485 Neoplasm of uncertain behavior of skin: Secondary | ICD-10-CM | POA: Diagnosis not present

## 2021-12-25 DIAGNOSIS — L814 Other melanin hyperpigmentation: Secondary | ICD-10-CM | POA: Diagnosis not present

## 2021-12-25 DIAGNOSIS — D225 Melanocytic nevi of trunk: Secondary | ICD-10-CM | POA: Diagnosis not present

## 2021-12-25 DIAGNOSIS — L821 Other seborrheic keratosis: Secondary | ICD-10-CM | POA: Diagnosis not present

## 2021-12-25 DIAGNOSIS — L570 Actinic keratosis: Secondary | ICD-10-CM | POA: Diagnosis not present

## 2021-12-25 DIAGNOSIS — Z85828 Personal history of other malignant neoplasm of skin: Secondary | ICD-10-CM | POA: Diagnosis not present

## 2021-12-25 DIAGNOSIS — L578 Other skin changes due to chronic exposure to nonionizing radiation: Secondary | ICD-10-CM | POA: Diagnosis not present

## 2021-12-25 DIAGNOSIS — L409 Psoriasis, unspecified: Secondary | ICD-10-CM | POA: Diagnosis not present

## 2022-01-04 DIAGNOSIS — Z23 Encounter for immunization: Secondary | ICD-10-CM | POA: Diagnosis not present

## 2022-01-28 DIAGNOSIS — M5416 Radiculopathy, lumbar region: Secondary | ICD-10-CM | POA: Diagnosis not present

## 2022-05-06 DIAGNOSIS — G5761 Lesion of plantar nerve, right lower limb: Secondary | ICD-10-CM | POA: Diagnosis not present

## 2022-05-06 DIAGNOSIS — M7752 Other enthesopathy of left foot: Secondary | ICD-10-CM | POA: Diagnosis not present

## 2022-05-06 DIAGNOSIS — M2042 Other hammer toe(s) (acquired), left foot: Secondary | ICD-10-CM | POA: Diagnosis not present

## 2022-05-13 DIAGNOSIS — M5416 Radiculopathy, lumbar region: Secondary | ICD-10-CM | POA: Diagnosis not present

## 2022-05-14 IMAGING — CT CT SHOULDER*L* W/O CM
1 of 2 series · 9 of 14 positions shown, 12 images · non-contrast
Comparison: None.

CLINICAL DATA: Left shoulder pain with decreased range of motion

EXAM:
CT OF THE UPPER LEFT EXTREMITY WITHOUT CONTRAST
TECHNIQUE: Multidetector CT imaging of the upper left extremity was performed
according to the standard protocol.

[Series 5: thin soft · axial · 0.51mm/px · z∈[-235,-63]mm · 9 of 359 slices shown, 12 images]
[im 36/359  soft-tissue]
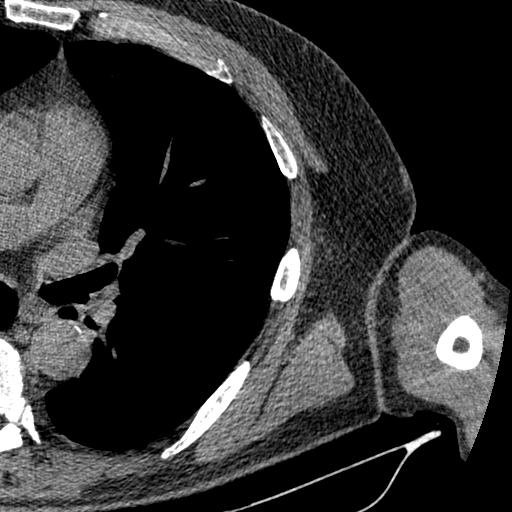
[im 36/359  bone]
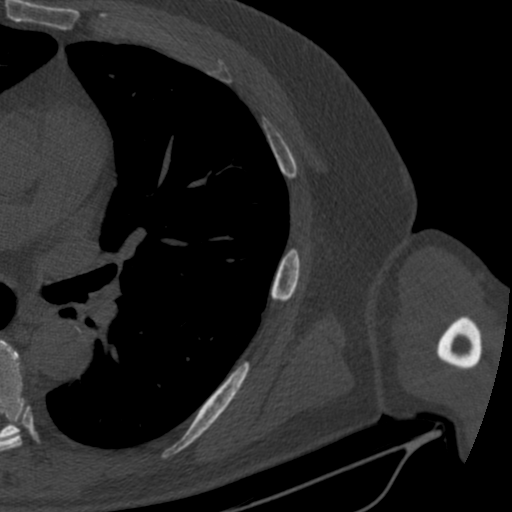
[im 72/359  bone]
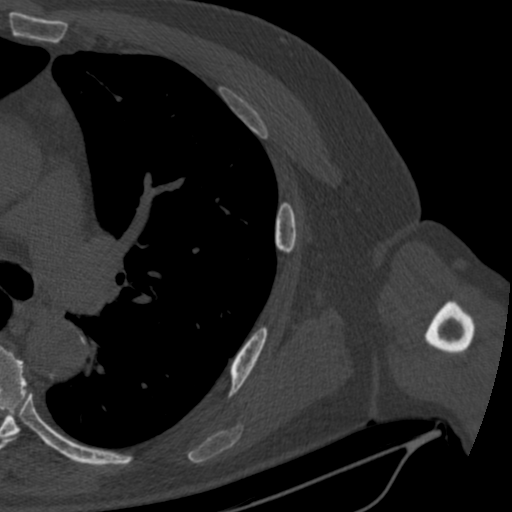
[im 108/359  bone]
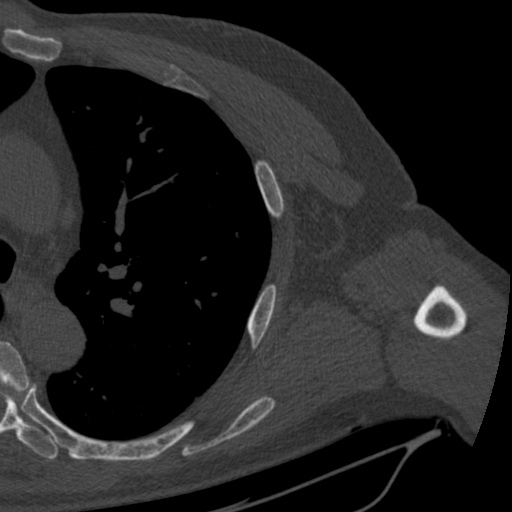
[im 144/359  bone]
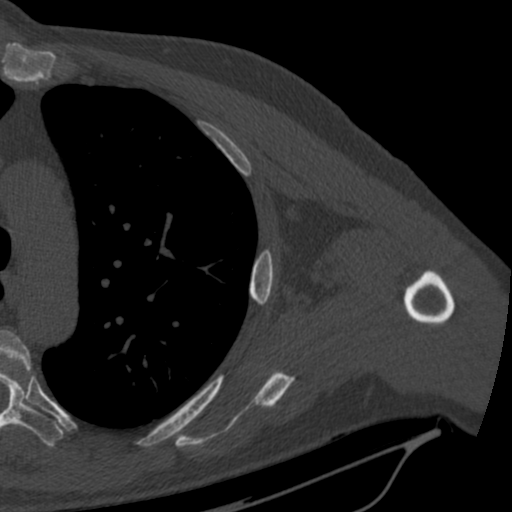
[im 180/359  soft-tissue]
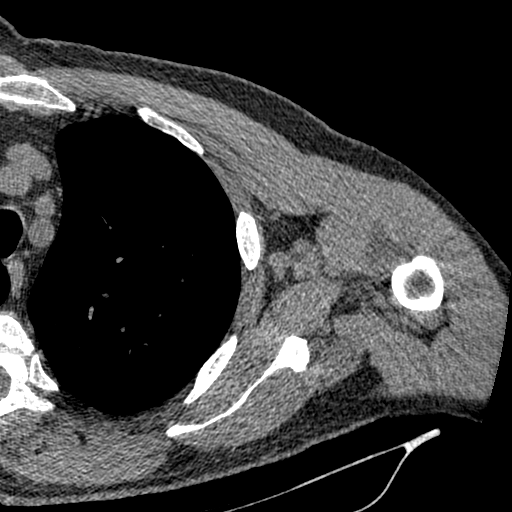
[im 180/359  bone]
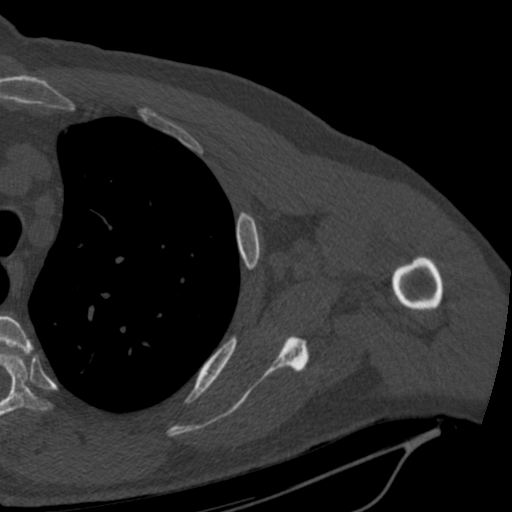
[im 215/359  bone]
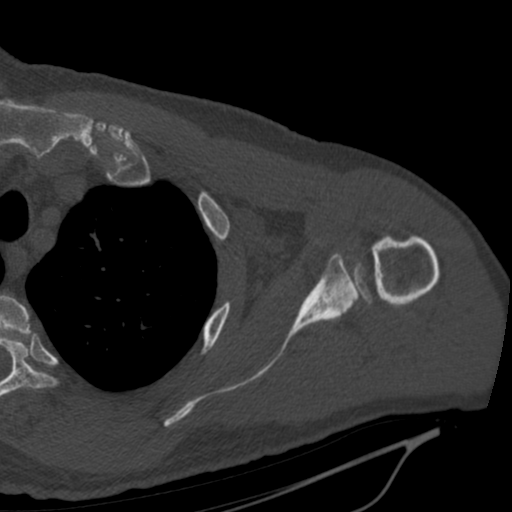
[im 251/359  bone]
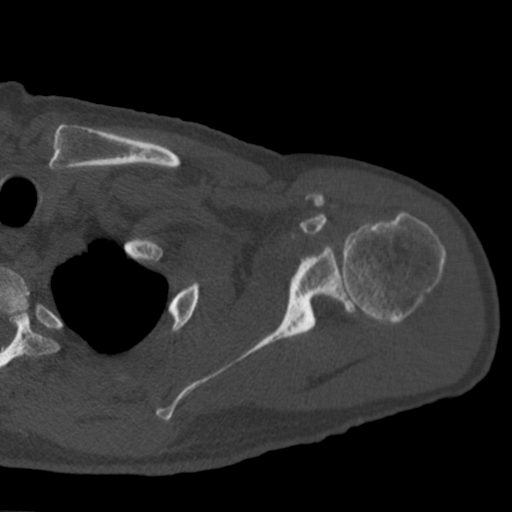
[im 287/359  bone]
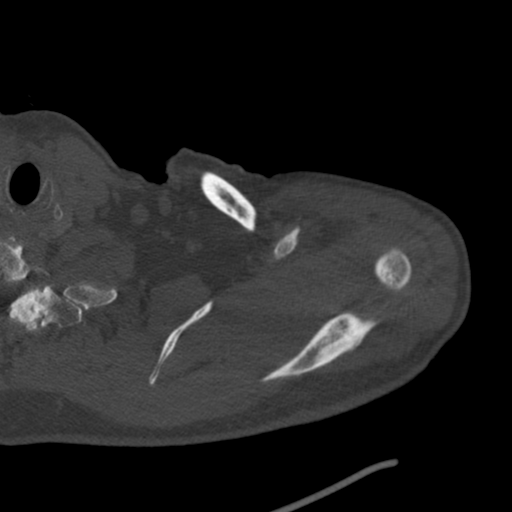
[im 323/359  soft-tissue]
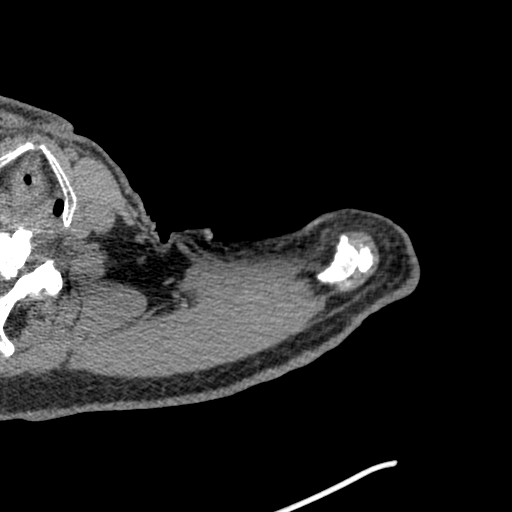
[im 323/359  bone]
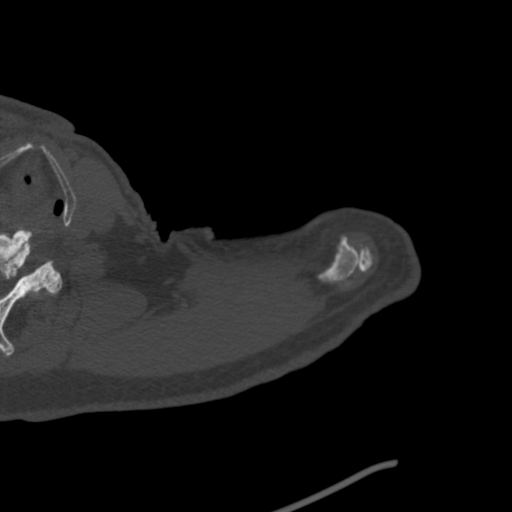

[9 of 14 positions shown; findings below may reference images not displayed]

FINDINGS: Bones/Joint/Cartilage

No acute fracture. No dislocation. Severe glenohumeral joint
osteoarthritis manifested by joint space narrowing, subchondral
sclerosis/cystic change, and marginal osteophyte formation. There
are prominent subchondral cystic changes within the glenoid, most
pronounced posteriorly. Glenohumeral joint effusion with fluid
distension of the subscapularis joint recess. Ossified 9 mm loose
body within the joint recess. There is fluid distension of the
biceps tendon sheath.

Mild-moderate degenerative changes of the acromioclavicular joint.
No large subacromial-subdeltoid bursal fluid collection. Remaining
visualized osseous structures appear grossly intact. Degenerative
disc disease and facet arthropathy within the visualized lower
cervical spine.

Ligaments

Suboptimally assessed by CT.

Muscles and Tendons

Preserved muscle bulk of the rotator cuff musculature without
significant atrophy or fatty infiltration. Rotator cuff tendons
appear grossly intact within the limitations of CT.

Soft tissues

No soft tissue fluid collection or hematoma. No axillary
lymphadenopathy. 6 mm pulmonary nodule abuts the pleural surface of
the posterior left lower lobe (series 3, image 103). Aortic
atherosclerosis.
IMPRESSION: 1. Severe left glenohumeral joint osteoarthritis.
2. Fluid distension of the biceps tendon sheath suggesting
tenosynovitis.
3. 6 mm pulmonary nodule abuts the pleural surface of the posterior
left lower lobe. Non-contrast chest CT at 6-12 months is
recommended. If the nodule is stable at time of repeat CT, then
future CT at 18-24 months (from today's scan) is considered optional
for low-risk patients, but is recommended for high-risk patients.
This recommendation follows the consensus statement: Guidelines for
Management of Incidental Pulmonary Nodules Detected on CT Images:

Aortic Atherosclerosis ([id number]-KDH.H).

## 2022-05-19 DIAGNOSIS — R509 Fever, unspecified: Secondary | ICD-10-CM | POA: Diagnosis not present

## 2022-05-21 DIAGNOSIS — Z125 Encounter for screening for malignant neoplasm of prostate: Secondary | ICD-10-CM | POA: Diagnosis not present

## 2022-05-21 DIAGNOSIS — E782 Mixed hyperlipidemia: Secondary | ICD-10-CM | POA: Diagnosis not present

## 2022-05-21 DIAGNOSIS — M159 Polyosteoarthritis, unspecified: Secondary | ICD-10-CM | POA: Diagnosis not present

## 2022-05-21 DIAGNOSIS — Z79899 Other long term (current) drug therapy: Secondary | ICD-10-CM | POA: Diagnosis not present

## 2022-05-21 DIAGNOSIS — Z0000 Encounter for general adult medical examination without abnormal findings: Secondary | ICD-10-CM | POA: Diagnosis not present

## 2022-05-21 DIAGNOSIS — I10 Essential (primary) hypertension: Secondary | ICD-10-CM | POA: Diagnosis not present

## 2022-09-16 DIAGNOSIS — M5416 Radiculopathy, lumbar region: Secondary | ICD-10-CM | POA: Diagnosis not present

## 2023-01-21 DIAGNOSIS — L570 Actinic keratosis: Secondary | ICD-10-CM | POA: Diagnosis not present

## 2023-01-21 DIAGNOSIS — D225 Melanocytic nevi of trunk: Secondary | ICD-10-CM | POA: Diagnosis not present

## 2023-01-21 DIAGNOSIS — L814 Other melanin hyperpigmentation: Secondary | ICD-10-CM | POA: Diagnosis not present

## 2023-01-21 DIAGNOSIS — L821 Other seborrheic keratosis: Secondary | ICD-10-CM | POA: Diagnosis not present

## 2023-01-21 DIAGNOSIS — L409 Psoriasis, unspecified: Secondary | ICD-10-CM | POA: Diagnosis not present

## 2023-01-21 DIAGNOSIS — L578 Other skin changes due to chronic exposure to nonionizing radiation: Secondary | ICD-10-CM | POA: Diagnosis not present

## 2023-01-21 DIAGNOSIS — Z85828 Personal history of other malignant neoplasm of skin: Secondary | ICD-10-CM | POA: Diagnosis not present

## 2023-02-03 DIAGNOSIS — M5416 Radiculopathy, lumbar region: Secondary | ICD-10-CM | POA: Diagnosis not present

## 2023-03-05 DIAGNOSIS — Z6828 Body mass index (BMI) 28.0-28.9, adult: Secondary | ICD-10-CM | POA: Diagnosis not present

## 2023-03-05 DIAGNOSIS — Z23 Encounter for immunization: Secondary | ICD-10-CM | POA: Diagnosis not present

## 2023-03-05 DIAGNOSIS — R0600 Dyspnea, unspecified: Secondary | ICD-10-CM | POA: Diagnosis not present

## 2023-03-05 DIAGNOSIS — I10 Essential (primary) hypertension: Secondary | ICD-10-CM | POA: Diagnosis not present

## 2023-03-12 DIAGNOSIS — M545 Low back pain, unspecified: Secondary | ICD-10-CM | POA: Diagnosis not present

## 2023-03-12 DIAGNOSIS — M47896 Other spondylosis, lumbar region: Secondary | ICD-10-CM | POA: Diagnosis not present

## 2023-03-18 DIAGNOSIS — M545 Low back pain, unspecified: Secondary | ICD-10-CM | POA: Diagnosis not present

## 2023-03-26 DIAGNOSIS — M5126 Other intervertebral disc displacement, lumbar region: Secondary | ICD-10-CM | POA: Diagnosis not present

## 2023-03-26 DIAGNOSIS — M47816 Spondylosis without myelopathy or radiculopathy, lumbar region: Secondary | ICD-10-CM | POA: Diagnosis not present

## 2023-03-26 DIAGNOSIS — M48061 Spinal stenosis, lumbar region without neurogenic claudication: Secondary | ICD-10-CM | POA: Diagnosis not present

## 2023-04-14 DIAGNOSIS — M47816 Spondylosis without myelopathy or radiculopathy, lumbar region: Secondary | ICD-10-CM | POA: Diagnosis not present

## 2023-04-22 DIAGNOSIS — M47816 Spondylosis without myelopathy or radiculopathy, lumbar region: Secondary | ICD-10-CM | POA: Diagnosis not present

## 2023-04-27 DIAGNOSIS — M47816 Spondylosis without myelopathy or radiculopathy, lumbar region: Secondary | ICD-10-CM | POA: Diagnosis not present

## 2023-05-26 DIAGNOSIS — M7062 Trochanteric bursitis, left hip: Secondary | ICD-10-CM | POA: Diagnosis not present

## 2023-05-26 DIAGNOSIS — M47816 Spondylosis without myelopathy or radiculopathy, lumbar region: Secondary | ICD-10-CM | POA: Diagnosis not present

## 2023-05-29 DIAGNOSIS — I10 Essential (primary) hypertension: Secondary | ICD-10-CM | POA: Diagnosis not present

## 2023-05-29 DIAGNOSIS — R0600 Dyspnea, unspecified: Secondary | ICD-10-CM | POA: Diagnosis not present

## 2023-05-29 DIAGNOSIS — Z87891 Personal history of nicotine dependence: Secondary | ICD-10-CM | POA: Diagnosis not present

## 2023-05-29 DIAGNOSIS — I498 Other specified cardiac arrhythmias: Secondary | ICD-10-CM | POA: Diagnosis not present

## 2023-06-26 DIAGNOSIS — R0600 Dyspnea, unspecified: Secondary | ICD-10-CM | POA: Diagnosis not present

## 2023-08-15 ENCOUNTER — Encounter (HOSPITAL_BASED_OUTPATIENT_CLINIC_OR_DEPARTMENT_OTHER): Payer: Self-pay

## 2023-08-15 ENCOUNTER — Ambulatory Visit (HOSPITAL_BASED_OUTPATIENT_CLINIC_OR_DEPARTMENT_OTHER): Admission: EM | Admit: 2023-08-15 | Discharge: 2023-08-15 | Disposition: A | Discharge status: Home / Self Care

## 2023-08-15 DIAGNOSIS — J0110 Acute frontal sinusitis, unspecified: Secondary | ICD-10-CM | POA: Diagnosis not present

## 2023-08-15 MED ORDER — AMOXICILLIN 500 MG PO CAPS: 500.0000 mg | ORAL_CAPSULE | Freq: Three times a day (TID) | ORAL | 0 refills | Status: AC

## 2023-08-15 MED ORDER — TRIAMCINOLONE ACETONIDE 40 MG/ML IJ SUSP
40.0000 mg | Freq: Once | INTRAMUSCULAR | Status: AC
  Administered 2023-08-15: 40 mg via INTRAMUSCULAR

## 2023-08-15 NOTE — Discharge Instructions (Signed)
 Treating you for a infection.  Take the medication as prescribed.  Recommend over-the-counter Mucinex for congestion and cough.  Steroid injection given here today. Follow-up as needed

## 2023-08-15 NOTE — ED Triage Notes (Signed)
 Was sick approx 3 weeks ago with the same. Seemed to get better and then returned approx 1 week ago. Chest congestion, both dry and moist cough, runny nose, sore throat. No fever.

## 2023-08-15 NOTE — ED Provider Notes (Signed)
 [facility]    CSN: [id number] Arrival date & time: 08/15/23  1007      History   Chief Complaint Chief Complaint  Patient presents with   Cough   chest congestion   Fatigue    HPI Darell KINNICK MAUS is a 78 y.o. male.   78 year old male presents with approximately 1 week of nasal congestion, drainage, cough, fatigue, sore throat.  Symptoms have been worsening over the last few days.  He does have a history of allergies and spends a lot of time outside.  He has been taking allergy medication.  No fevers or chills.  No chest pain or shortness of breath.   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   DDD (degenerative disc disease)    DVT (deep venous thrombosis) (HCC)    DVT 6 weeks after knee surgery- 2015- off Lovenox and Coumadin   Eczema    History of nonmelanoma skin cancer    basel cell removed from scalp   Hypercholesteremia    Hypertension    Pinched nerve    right toe- taking Neurontin   Rash    abd    Patient Active Problem List   Diagnosis Date Noted   DJD (degenerative joint disease) of knee 07/12/2013   Postoperative anemia due to acute blood loss 07/11/2013   Essential hypertension 07/11/2013   Pure hypercholesterolemia 07/11/2013   OA (osteoarthritis) of knee 07/08/2013    Past Surgical History:  Procedure Laterality Date   BACK SURGERY     x 2   COLONOSCOPY     NOSE SURGERY     TOTAL KNEE ARTHROPLASTY Bilateral 07/08/2013   Procedure: TOTAL KNEE BILATERAL;  Surgeon: Aurther Blue, MD;  Location: [facility];  Service: Orthopedics;  Laterality: Bilateral;   TOTAL SHOULDER ARTHROPLASTY Left 05/17/2020   Procedure: LEFT TOTAL SHOULDER ARTHROPLASTY;  Surgeon: Ellard Gunning, MD;  Location: [facility];  Service: Orthopedics;  Laterality: Left;   VASECTOMY     VASECTOMY REVERSAL         Home Medications    Prior to Admission medications   Medication Sig  Start Date End Date Taking? Authorizing Provider  amoxicillin (AMOXIL) 500 MG capsule Take 1 capsule (500 mg total) by mouth 3 (three) times daily. 08/15/23  Yes Hobart Marte A, FNP  lisinopril-hydrochlorothiazide (ZESTORETIC) 20-12.5 MG tablet Take 1 tablet by mouth daily. 08/13/23  Yes [provider]  diclofenac Sodium (VOLTAREN) 1 % GEL Apply 1 application topically 4 (four) times daily as needed (pain).    [provider]  gabapentin (NEURONTIN) 100 MG capsule Take 200 mg by mouth daily as needed (pain). 05/19/19   [provider]    Family History Family History  Problem Relation Age of Onset   Colon cancer Paternal Grandmother    Colon cancer Paternal Grandfather    Esophageal cancer Neg Hx    Rectal cancer Neg Hx    Stomach cancer Neg Hx     Social History Social History   Tobacco Use   Smoking status: Former    Current packs/day: 0.00    Types: Cigarettes    Quit date: 07/06/1998    Years since quitting: 25.1   Smokeless tobacco: Former  Building services engineer status: Never Used  Substance Use Topics   Alcohol use: Yes    Comment: occasional  Drug use: No     Allergies   Patient has no known allergies.   Review of Systems Review of Systems  Respiratory:  Positive for cough.    See HPI  Physical Exam Triage Vital Signs ED Triage Vitals  Encounter Vitals Group     BP 08/15/23 1040 135/86     Systolic BP Percentile --      Diastolic BP Percentile --      Pulse Rate 08/15/23 1040 83     Resp 08/15/23 1040 20     Temp 08/15/23 1040 98.5 F (36.9 C)     Temp Source 08/15/23 1040 Oral     SpO2 08/15/23 1040 97 %     Weight --      Height --      Head Circumference --      Peak Flow --      Pain Score 08/15/23 1042 4     Pain Loc --      Pain Education --      Exclude from Growth Chart --    No data found.  Updated Vital Signs BP 135/86 (BP Location: Right Arm)   Pulse 83   Temp 98.5 F (36.9 C) (Oral)   Resp 20   SpO2  97%   Visual Acuity Right Eye Distance:   Left Eye Distance:   Bilateral Distance:    Right Eye Near:   Left Eye Near:    Bilateral Near:     Physical Exam Vitals and nursing note reviewed.  Constitutional:      General: He is not in acute distress.    Appearance: He is well-developed. He is not toxic-appearing.  HENT:     Head: Normocephalic and atraumatic.     Right Ear: Tympanic membrane and ear canal normal.     Left Ear: Tympanic membrane and ear canal normal.     Nose: Congestion and rhinorrhea present.     Mouth/Throat:     Pharynx: Oropharynx is clear.     Comments: PND Eyes:     Conjunctiva/sclera: Conjunctivae normal.  Cardiovascular:     Rate and Rhythm: Normal rate and regular rhythm.     Heart sounds: No murmur heard. Pulmonary:     Effort: Pulmonary effort is normal. No respiratory distress.     Breath sounds: Normal breath sounds.  Skin:    General: Skin is warm and dry.  Neurological:     Mental Status: He is alert.  Psychiatric:        Mood and Affect: Mood normal.      UC Treatments / Results  Labs (all labs ordered are listed, but only abnormal results are displayed) Labs Reviewed - No data to display  EKG   Radiology No results found.  Procedures Procedures (including critical care time)  Medications Ordered in UC Medications  triamcinolone acetonide (KENALOG-40) injection 40 mg (40 mg Intramuscular Given 08/15/23 1112)    Initial Impression / Assessment and Plan / UC Course  I have reviewed the triage vital signs and the nursing notes.  Pertinent labs & imaging results that were available during my care of the patient were reviewed by me and considered in my medical decision making (see chart for details).     Sinusitis-treated for a sinus infection based on exam and symptoms. Tessalon as prescribed.  Was given a steroid injection here. Recommend Mucinex over-the-counter for cough and congestion Follow-up as needed Final  Clinical Impressions(s) / UC Diagnoses   Final  diagnoses:  Acute non-recurrent frontal sinusitis     Discharge Instructions      Treating you for a infection.  Take the medication as prescribed.  Recommend over-the-counter Mucinex for congestion and cough.  Steroid injection given here today. Follow-up as needed    ED Prescriptions     Medication Sig Dispense Auth. Provider   amoxicillin (AMOXIL) 500 MG capsule Take 1 capsule (500 mg total) by mouth 3 (three) times daily. 21 capsule Landa Pine, FNP      PDMP not reviewed this encounter.   Landa Pine, FNP 08/15/23 1155

## 2023-09-17 DIAGNOSIS — M5416 Radiculopathy, lumbar region: Secondary | ICD-10-CM | POA: Diagnosis not present

## 2023-09-17 DIAGNOSIS — M47816 Spondylosis without myelopathy or radiculopathy, lumbar region: Secondary | ICD-10-CM | POA: Diagnosis not present

## 2023-09-17 DIAGNOSIS — Z1330 Encounter for screening examination for mental health and behavioral disorders, unspecified: Secondary | ICD-10-CM | POA: Diagnosis not present

## 2023-09-17 DIAGNOSIS — M791 Myalgia, unspecified site: Secondary | ICD-10-CM | POA: Diagnosis not present

## 2023-09-24 DIAGNOSIS — M5416 Radiculopathy, lumbar region: Secondary | ICD-10-CM | POA: Diagnosis not present

## 2023-09-29 DIAGNOSIS — M7062 Trochanteric bursitis, left hip: Secondary | ICD-10-CM | POA: Diagnosis not present

## 2023-09-29 DIAGNOSIS — M549 Dorsalgia, unspecified: Secondary | ICD-10-CM | POA: Diagnosis not present

## 2023-10-06 DIAGNOSIS — M5416 Radiculopathy, lumbar region: Secondary | ICD-10-CM | POA: Diagnosis not present

## 2023-10-08 DIAGNOSIS — S76311A Strain of muscle, fascia and tendon of the posterior muscle group at thigh level, right thigh, initial encounter: Secondary | ICD-10-CM | POA: Diagnosis not present

## 2023-10-21 DIAGNOSIS — M5416 Radiculopathy, lumbar region: Secondary | ICD-10-CM | POA: Diagnosis not present

## 2023-10-21 DIAGNOSIS — M4726 Other spondylosis with radiculopathy, lumbar region: Secondary | ICD-10-CM | POA: Diagnosis not present

## 2023-10-28 DIAGNOSIS — M5416 Radiculopathy, lumbar region: Secondary | ICD-10-CM | POA: Diagnosis not present

## 2023-12-10 DIAGNOSIS — M51362 Other intervertebral disc degeneration, lumbar region with discogenic back pain and lower extremity pain: Secondary | ICD-10-CM | POA: Diagnosis not present

## 2023-12-10 DIAGNOSIS — M5416 Radiculopathy, lumbar region: Secondary | ICD-10-CM | POA: Diagnosis not present

## 2023-12-10 DIAGNOSIS — M5116 Intervertebral disc disorders with radiculopathy, lumbar region: Secondary | ICD-10-CM | POA: Diagnosis not present

## 2023-12-10 DIAGNOSIS — M48062 Spinal stenosis, lumbar region with neurogenic claudication: Secondary | ICD-10-CM | POA: Diagnosis not present

## 2023-12-14 DIAGNOSIS — M47816 Spondylosis without myelopathy or radiculopathy, lumbar region: Secondary | ICD-10-CM | POA: Diagnosis not present

## 2023-12-29 DIAGNOSIS — M47816 Spondylosis without myelopathy or radiculopathy, lumbar region: Secondary | ICD-10-CM | POA: Diagnosis not present

## 2024-01-12 DIAGNOSIS — L814 Other melanin hyperpigmentation: Secondary | ICD-10-CM | POA: Diagnosis not present

## 2024-01-12 DIAGNOSIS — D225 Melanocytic nevi of trunk: Secondary | ICD-10-CM | POA: Diagnosis not present

## 2024-01-12 DIAGNOSIS — Z85828 Personal history of other malignant neoplasm of skin: Secondary | ICD-10-CM | POA: Diagnosis not present

## 2024-01-12 DIAGNOSIS — L570 Actinic keratosis: Secondary | ICD-10-CM | POA: Diagnosis not present

## 2024-01-12 DIAGNOSIS — L578 Other skin changes due to chronic exposure to nonionizing radiation: Secondary | ICD-10-CM | POA: Diagnosis not present

## 2024-01-12 DIAGNOSIS — L821 Other seborrheic keratosis: Secondary | ICD-10-CM | POA: Diagnosis not present

## 2024-01-12 DIAGNOSIS — L409 Psoriasis, unspecified: Secondary | ICD-10-CM | POA: Diagnosis not present

## 2024-01-14 DIAGNOSIS — M47816 Spondylosis without myelopathy or radiculopathy, lumbar region: Secondary | ICD-10-CM | POA: Diagnosis not present

## 2024-03-08 DIAGNOSIS — M5416 Radiculopathy, lumbar region: Secondary | ICD-10-CM | POA: Diagnosis not present

## 2024-03-09 DIAGNOSIS — Z6830 Body mass index (BMI) 30.0-30.9, adult: Secondary | ICD-10-CM | POA: Diagnosis not present

## 2024-03-09 DIAGNOSIS — M47816 Spondylosis without myelopathy or radiculopathy, lumbar region: Secondary | ICD-10-CM | POA: Diagnosis not present

## 2024-03-14 DIAGNOSIS — R6883 Chills (without fever): Secondary | ICD-10-CM | POA: Diagnosis not present

## 2024-03-14 DIAGNOSIS — Z6829 Body mass index (BMI) 29.0-29.9, adult: Secondary | ICD-10-CM | POA: Diagnosis not present

## 2024-03-14 DIAGNOSIS — J0120 Acute ethmoidal sinusitis, unspecified: Secondary | ICD-10-CM | POA: Diagnosis not present

## 2024-03-14 DIAGNOSIS — R059 Cough, unspecified: Secondary | ICD-10-CM | POA: Diagnosis not present

## 2024-03-14 DIAGNOSIS — R051 Acute cough: Secondary | ICD-10-CM | POA: Diagnosis not present

## 2024-04-12 DIAGNOSIS — R0981 Nasal congestion: Secondary | ICD-10-CM | POA: Diagnosis not present

## 2024-04-12 DIAGNOSIS — M159 Polyosteoarthritis, unspecified: Secondary | ICD-10-CM | POA: Diagnosis not present

## 2024-04-12 DIAGNOSIS — Z6829 Body mass index (BMI) 29.0-29.9, adult: Secondary | ICD-10-CM | POA: Diagnosis not present

## 2024-04-12 DIAGNOSIS — K219 Gastro-esophageal reflux disease without esophagitis: Secondary | ICD-10-CM | POA: Diagnosis not present
